# Patient Record
Sex: Female | Born: 1945 | Race: White | Hispanic: No | Marital: Married | Smoking: Never smoker
Health system: Southern US, Community
[De-identification: ages and names within clinical notes are randomized; demographics above are authoritative.]

## PROBLEM LIST (undated history)

## (undated) DIAGNOSIS — R0600 Dyspnea, unspecified: Secondary | ICD-10-CM

## (undated) DIAGNOSIS — Z98811 Dental restoration status: Secondary | ICD-10-CM

## (undated) DIAGNOSIS — F32A Depression, unspecified: Secondary | ICD-10-CM

## (undated) DIAGNOSIS — M1990 Unspecified osteoarthritis, unspecified site: Secondary | ICD-10-CM

## (undated) DIAGNOSIS — Z1211 Encounter for screening for malignant neoplasm of colon: Secondary | ICD-10-CM

## (undated) DIAGNOSIS — N200 Calculus of kidney: Secondary | ICD-10-CM

## (undated) DIAGNOSIS — K649 Unspecified hemorrhoids: Secondary | ICD-10-CM

## (undated) DIAGNOSIS — K219 Gastro-esophageal reflux disease without esophagitis: Secondary | ICD-10-CM

## (undated) DIAGNOSIS — F329 Major depressive disorder, single episode, unspecified: Secondary | ICD-10-CM

## (undated) HISTORY — PX: BREAST SURGERY: SHX581

## (undated) HISTORY — PX: ARTHOSCOPIC ROTAOR CUFF REPAIR: SHX5002

## (undated) HISTORY — PX: LEG SURGERY: SHX1003

## (undated) HISTORY — PX: ABDOMINAL HYSTERECTOMY: SHX81

## (undated) HISTORY — DX: Encounter for screening for malignant neoplasm of colon: Z12.11

---

## 1999-01-23 ENCOUNTER — Encounter: Payer: Self-pay | Admitting: Urology

## 1999-01-23 ENCOUNTER — Encounter: Admission: RE | Admit: 1999-01-23 | Discharge: 1999-01-23 | Payer: Self-pay | Admitting: Urology

## 2000-02-12 ENCOUNTER — Encounter: Admission: RE | Admit: 2000-02-12 | Discharge: 2000-02-12 | Payer: Self-pay | Admitting: Gynecology

## 2000-02-12 ENCOUNTER — Encounter: Payer: Self-pay | Admitting: Gynecology

## 2001-01-31 ENCOUNTER — Inpatient Hospital Stay (HOSPITAL_COMMUNITY): Admission: EM | Admit: 2001-01-31 | Discharge: 2001-02-04 | Payer: Self-pay

## 2001-01-31 ENCOUNTER — Encounter: Payer: Self-pay | Admitting: Emergency Medicine

## 2001-01-31 ENCOUNTER — Encounter: Payer: Self-pay | Admitting: Surgery

## 2001-02-01 ENCOUNTER — Encounter: Payer: Self-pay | Admitting: Orthopedic Surgery

## 2001-07-09 ENCOUNTER — Ambulatory Visit (HOSPITAL_COMMUNITY): Admission: AD | Admit: 2001-07-09 | Discharge: 2001-07-09 | Payer: Self-pay | Admitting: Urology

## 2004-05-23 ENCOUNTER — Ambulatory Visit: Payer: Self-pay | Admitting: Internal Medicine

## 2004-11-09 ENCOUNTER — Ambulatory Visit: Payer: Self-pay

## 2004-12-13 ENCOUNTER — Encounter: Payer: Self-pay | Admitting: General Practice

## 2004-12-27 ENCOUNTER — Encounter: Payer: Self-pay | Admitting: General Practice

## 2005-01-26 ENCOUNTER — Encounter: Payer: Self-pay | Admitting: General Practice

## 2005-05-14 ENCOUNTER — Emergency Department: Payer: Self-pay | Admitting: Emergency Medicine

## 2005-10-01 ENCOUNTER — Ambulatory Visit: Payer: Self-pay | Admitting: Urology

## 2006-03-25 ENCOUNTER — Ambulatory Visit: Payer: Self-pay | Admitting: Urology

## 2006-04-03 ENCOUNTER — Ambulatory Visit: Payer: Self-pay | Admitting: Urology

## 2006-04-09 ENCOUNTER — Ambulatory Visit: Payer: Self-pay | Admitting: Internal Medicine

## 2006-06-29 ENCOUNTER — Emergency Department: Payer: Self-pay | Admitting: Emergency Medicine

## 2006-12-03 ENCOUNTER — Ambulatory Visit: Payer: Self-pay | Admitting: Gastroenterology

## 2006-12-16 ENCOUNTER — Ambulatory Visit: Payer: Self-pay | Admitting: Internal Medicine

## 2007-06-03 ENCOUNTER — Ambulatory Visit: Payer: Self-pay | Admitting: Internal Medicine

## 2007-10-10 ENCOUNTER — Emergency Department: Payer: Self-pay | Admitting: Emergency Medicine

## 2008-05-31 ENCOUNTER — Ambulatory Visit: Payer: Self-pay

## 2008-06-07 ENCOUNTER — Ambulatory Visit: Payer: Self-pay | Admitting: Internal Medicine

## 2009-02-28 ENCOUNTER — Ambulatory Visit: Payer: Self-pay | Admitting: Urology

## 2009-04-04 ENCOUNTER — Ambulatory Visit: Payer: Self-pay | Admitting: Urology

## 2009-04-26 ENCOUNTER — Ambulatory Visit: Payer: Self-pay | Admitting: Urology

## 2009-08-03 ENCOUNTER — Observation Stay: Payer: Self-pay | Admitting: Internal Medicine

## 2009-10-03 ENCOUNTER — Ambulatory Visit: Payer: Self-pay | Admitting: Internal Medicine

## 2011-01-08 ENCOUNTER — Ambulatory Visit: Payer: Self-pay | Admitting: Urology

## 2011-01-23 ENCOUNTER — Ambulatory Visit: Payer: Self-pay | Admitting: Internal Medicine

## 2011-06-05 ENCOUNTER — Ambulatory Visit: Payer: Self-pay | Admitting: Urology

## 2011-07-09 ENCOUNTER — Ambulatory Visit: Payer: Self-pay | Admitting: Unknown Physician Specialty

## 2012-07-08 ENCOUNTER — Ambulatory Visit: Payer: Self-pay | Admitting: Internal Medicine

## 2012-07-22 ENCOUNTER — Ambulatory Visit: Payer: Self-pay | Admitting: Internal Medicine

## 2012-11-30 IMAGING — CR DG ABDOMEN 1V
1 series · 2 of 2 positions shown · non-contrast
Comparison: none

REASON FOR EXAM: Nephrolithiasis and Renal Colic
COMMENTS:

[Series 1: t abdomen supine · 0.14mm/px · 2 of 2 slices shown]
[im 1/2]
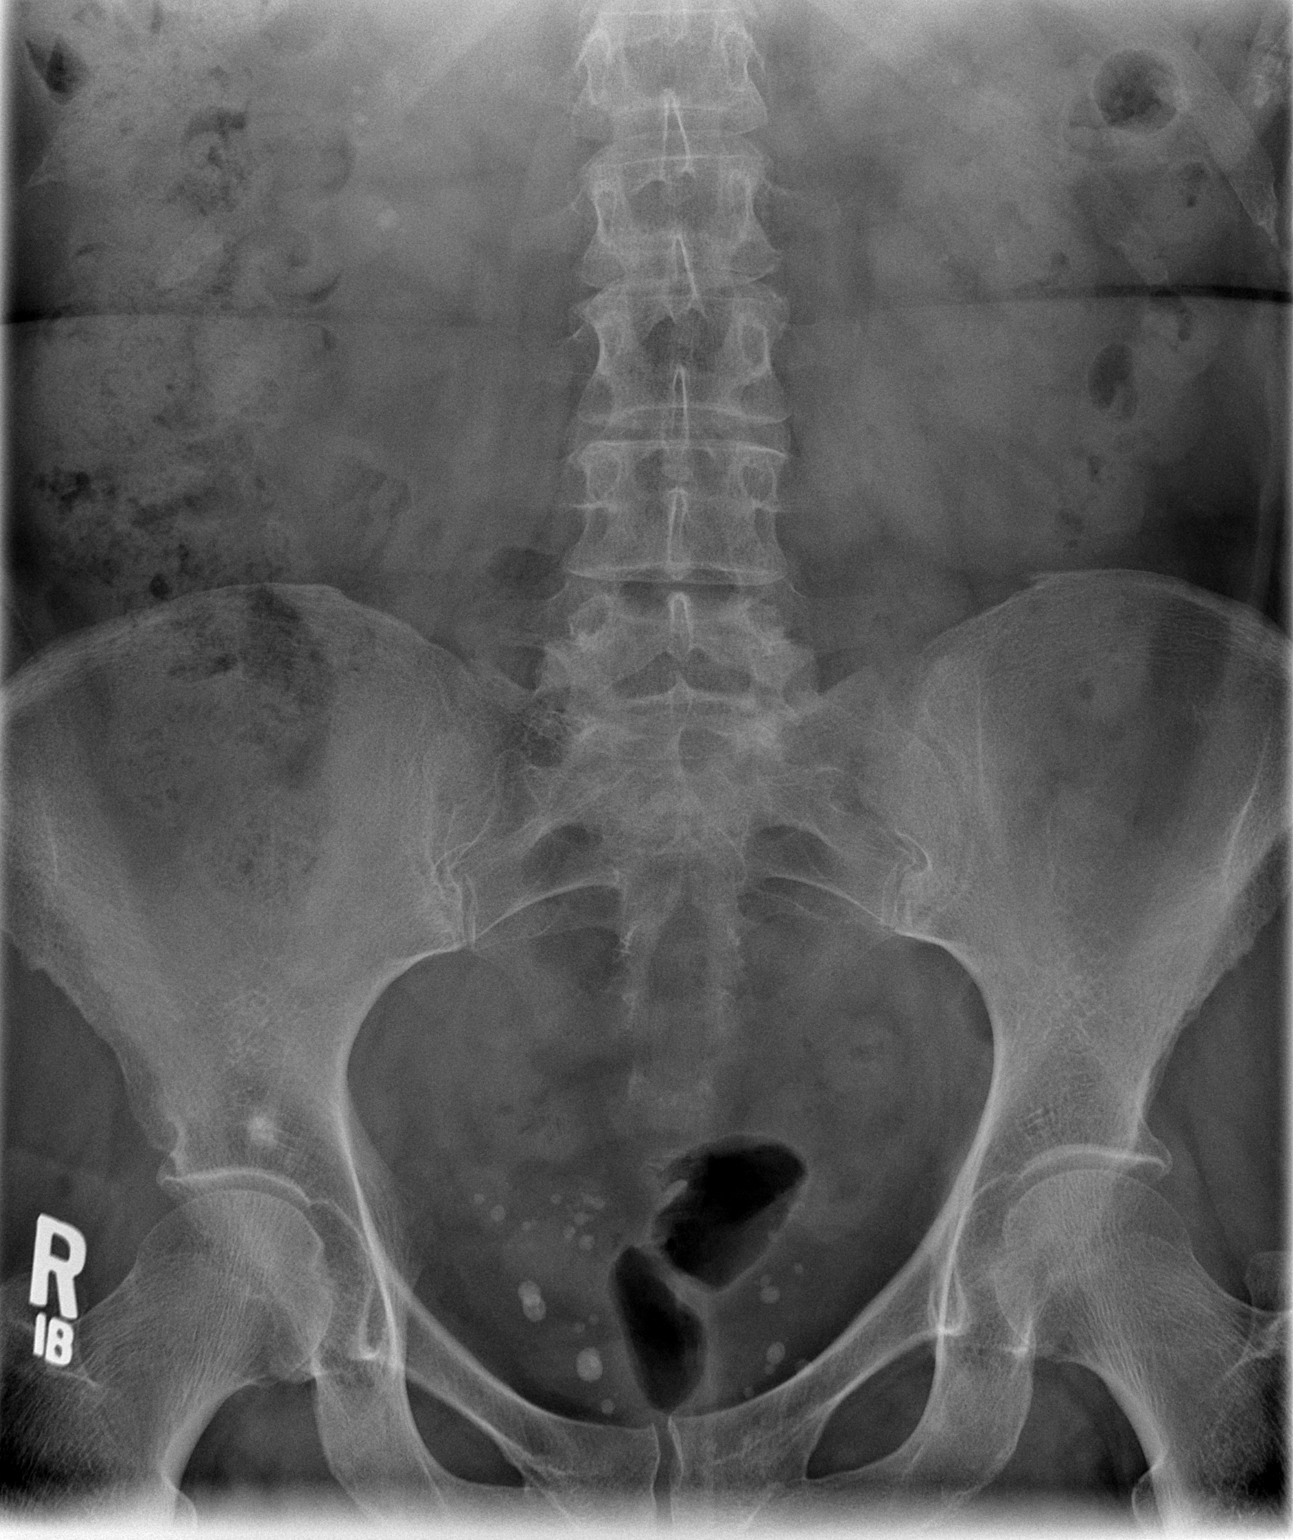
[im 2/2]
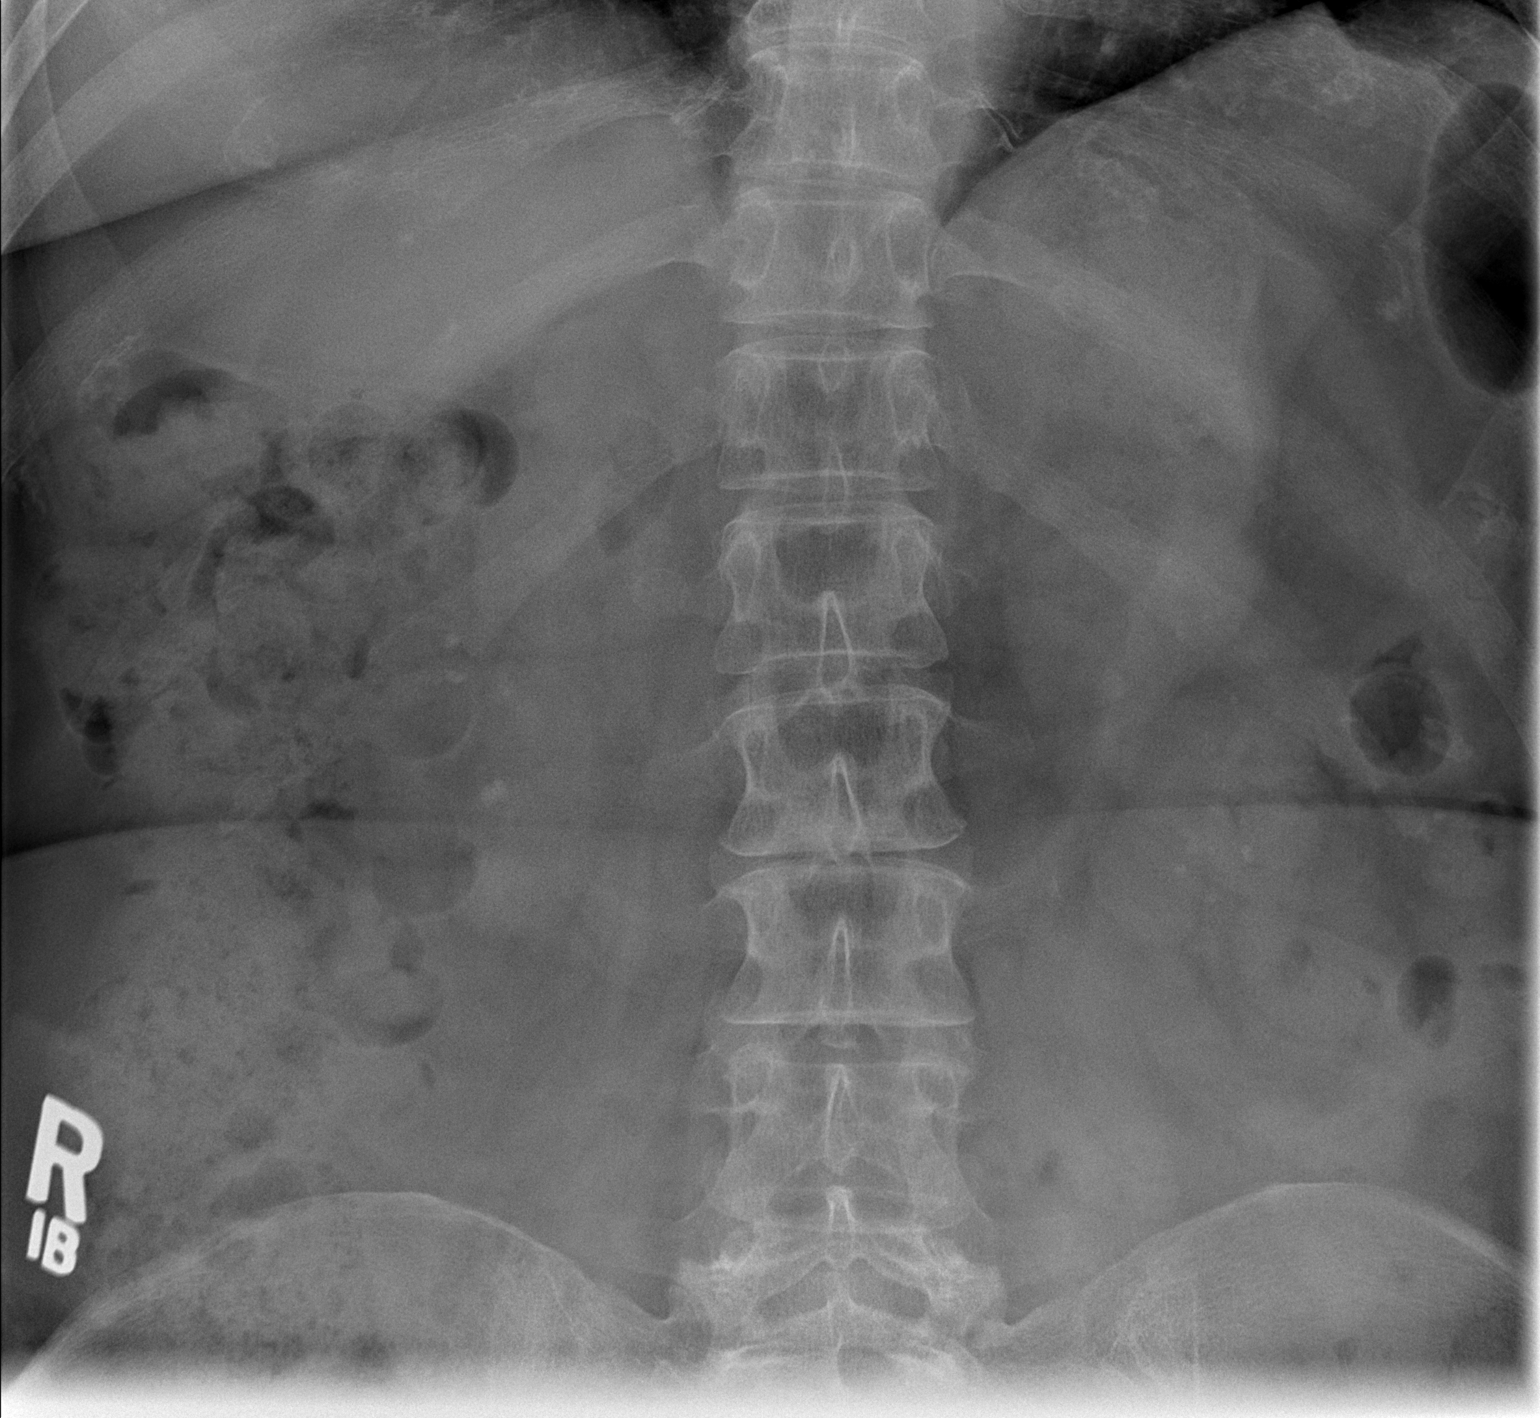

[2 of 2 positions shown; findings below may reference images not displayed]

PROCEDURE:     DXR - DXR KIDNEY URETER BLADDER  - June 05, 2011  [DATE]

RESULT:     Comparison is made to the prior exam of 01/08/2011. There are
again noted multiple 3 to 4 mm calcifications in the mid and lower pole
regions of the left kidney. Calcifications are also seen in the mid and
lower pole of the right kidney with the largest being located at the right
lower pole and measuring approximately 7 mm in diameter. There are noted
multiple calcifications in the pelvic area bilaterally consistent with
phleboliths. No definite ureteral calcifications are seen. The bowel gas
pattern is normal. There is a moderately large amount of fecal material in
the colon. No acute bony abnormalities are seen.
IMPRESSION: 1.     Bilateral nephrolithiasis.

## 2013-11-10 DIAGNOSIS — R05 Cough: Secondary | ICD-10-CM | POA: Insufficient documentation

## 2013-11-10 DIAGNOSIS — F329 Major depressive disorder, single episode, unspecified: Secondary | ICD-10-CM | POA: Insufficient documentation

## 2013-11-10 DIAGNOSIS — F32A Depression, unspecified: Secondary | ICD-10-CM | POA: Insufficient documentation

## 2013-11-10 DIAGNOSIS — N6019 Diffuse cystic mastopathy of unspecified breast: Secondary | ICD-10-CM | POA: Insufficient documentation

## 2013-11-10 DIAGNOSIS — R059 Cough, unspecified: Secondary | ICD-10-CM | POA: Insufficient documentation

## 2013-11-10 DIAGNOSIS — K635 Polyp of colon: Secondary | ICD-10-CM | POA: Insufficient documentation

## 2013-11-23 DIAGNOSIS — M25519 Pain in unspecified shoulder: Secondary | ICD-10-CM | POA: Insufficient documentation

## 2014-01-05 ENCOUNTER — Emergency Department: Payer: Self-pay | Admitting: Emergency Medicine

## 2014-01-30 ENCOUNTER — Emergency Department: Payer: Self-pay | Admitting: Emergency Medicine

## 2014-01-30 LAB — CBC
HCT: 39.8 % (ref 35.0–47.0)
HGB: 13 g/dL (ref 12.0–16.0)
MCH: 29.5 pg (ref 26.0–34.0)
MCHC: 32.6 g/dL (ref 32.0–36.0)
MCV: 91 fL (ref 80–100)
Platelet: 258 10*3/uL (ref 150–440)
RBC: 4.4 10*6/uL (ref 3.80–5.20)
RDW: 13.7 % (ref 11.5–14.5)
WBC: 10.6 10*3/uL (ref 3.6–11.0)

## 2014-01-30 LAB — URINALYSIS, COMPLETE
Bilirubin,UR: NEGATIVE
Glucose,UR: NEGATIVE mg/dL (ref 0–75)
Ketone: NEGATIVE
NITRITE: NEGATIVE
PH: 5 (ref 4.5–8.0)
Protein: 30
RBC,UR: 223 /HPF (ref 0–5)
Specific Gravity: 1.021 (ref 1.003–1.030)
WBC UR: 104 /HPF (ref 0–5)

## 2014-01-30 LAB — COMPREHENSIVE METABOLIC PANEL
ALT: 36 U/L
Albumin: 3.7 g/dL (ref 3.4–5.0)
Alkaline Phosphatase: 92 U/L
Anion Gap: 7 (ref 7–16)
BUN: 20 mg/dL — ABNORMAL HIGH (ref 7–18)
Bilirubin,Total: 0.2 mg/dL (ref 0.2–1.0)
CALCIUM: 8.8 mg/dL (ref 8.5–10.1)
CHLORIDE: 105 mmol/L (ref 98–107)
CO2: 28 mmol/L (ref 21–32)
Creatinine: 1.17 mg/dL (ref 0.60–1.30)
EGFR (African American): 60 — ABNORMAL LOW
EGFR (Non-African Amer.): 49 — ABNORMAL LOW
Glucose: 113 mg/dL — ABNORMAL HIGH (ref 65–99)
Osmolality: 283 (ref 275–301)
Potassium: 3.5 mmol/L (ref 3.5–5.1)
SGOT(AST): 33 U/L (ref 15–37)
Sodium: 140 mmol/L (ref 136–145)
Total Protein: 7.5 g/dL (ref 6.4–8.2)

## 2014-01-30 LAB — LIPASE, BLOOD: Lipase: 230 U/L (ref 73–393)

## 2014-03-16 DIAGNOSIS — N3946 Mixed incontinence: Secondary | ICD-10-CM | POA: Insufficient documentation

## 2014-03-16 DIAGNOSIS — N23 Unspecified renal colic: Secondary | ICD-10-CM | POA: Insufficient documentation

## 2014-03-16 DIAGNOSIS — N200 Calculus of kidney: Secondary | ICD-10-CM | POA: Insufficient documentation

## 2014-08-21 ENCOUNTER — Emergency Department (HOSPITAL_COMMUNITY)
Admission: EM | Admit: 2014-08-21 | Discharge: 2014-08-22 | Disposition: A | Discharge status: Home / Self Care | Payer: Medicare Other | Attending: Emergency Medicine | Admitting: Emergency Medicine

## 2014-08-21 ENCOUNTER — Encounter (HOSPITAL_COMMUNITY): Payer: Self-pay | Admitting: *Deleted

## 2014-08-21 DIAGNOSIS — R509 Fever, unspecified: Secondary | ICD-10-CM | POA: Insufficient documentation

## 2014-08-21 DIAGNOSIS — M791 Myalgia: Secondary | ICD-10-CM | POA: Insufficient documentation

## 2014-08-21 DIAGNOSIS — Z79899 Other long term (current) drug therapy: Secondary | ICD-10-CM | POA: Insufficient documentation

## 2014-08-21 DIAGNOSIS — R000 Tachycardia, unspecified: Secondary | ICD-10-CM | POA: Diagnosis not present

## 2014-08-21 DIAGNOSIS — J029 Acute pharyngitis, unspecified: Secondary | ICD-10-CM | POA: Diagnosis not present

## 2014-08-21 DIAGNOSIS — R531 Weakness: Secondary | ICD-10-CM | POA: Diagnosis present

## 2014-08-21 DIAGNOSIS — R3915 Urgency of urination: Secondary | ICD-10-CM | POA: Diagnosis not present

## 2014-08-21 DIAGNOSIS — R6889 Other general symptoms and signs: Secondary | ICD-10-CM

## 2014-08-21 HISTORY — DX: Depression, unspecified: F32.A

## 2014-08-21 HISTORY — DX: Major depressive disorder, single episode, unspecified: F32.9

## 2014-08-21 NOTE — ED Notes (Signed)
Pt reports general body aches & weakness that started this morning around 0900.

## 2014-08-22 ENCOUNTER — Emergency Department (HOSPITAL_COMMUNITY): Payer: Medicare Other

## 2014-08-22 LAB — CBC WITH DIFFERENTIAL/PLATELET
Basophils Absolute: 0 10*3/uL (ref 0.0–0.1)
Basophils Relative: 0 % (ref 0–1)
Eosinophils Absolute: 0.2 10*3/uL (ref 0.0–0.7)
Eosinophils Relative: 3 % (ref 0–5)
HEMATOCRIT: 40.5 % (ref 36.0–46.0)
HEMOGLOBIN: 13 g/dL (ref 12.0–15.0)
Lymphocytes Relative: 13 % (ref 12–46)
Lymphs Abs: 0.8 10*3/uL (ref 0.7–4.0)
MCH: 28.4 pg (ref 26.0–34.0)
MCHC: 32.1 g/dL (ref 30.0–36.0)
MCV: 88.4 fL (ref 78.0–100.0)
MONOS PCT: 9 % (ref 3–12)
Monocytes Absolute: 0.5 10*3/uL (ref 0.1–1.0)
NEUTROS PCT: 75 % (ref 43–77)
Neutro Abs: 4.3 10*3/uL (ref 1.7–7.7)
PLATELETS: 217 10*3/uL (ref 150–400)
RBC: 4.58 MIL/uL (ref 3.87–5.11)
RDW: 13.6 % (ref 11.5–15.5)
WBC: 5.8 10*3/uL (ref 4.0–10.5)

## 2014-08-22 LAB — URINE MICROSCOPIC-ADD ON

## 2014-08-22 LAB — COMPREHENSIVE METABOLIC PANEL
ALBUMIN: 3.8 g/dL (ref 3.5–5.0)
ALT: 22 U/L (ref 14–54)
ANION GAP: 9 (ref 5–15)
AST: 29 U/L (ref 15–41)
Alkaline Phosphatase: 92 U/L (ref 38–126)
BUN: 16 mg/dL (ref 6–20)
CALCIUM: 8.7 mg/dL — AB (ref 8.9–10.3)
CO2: 26 mmol/L (ref 22–32)
Chloride: 103 mmol/L (ref 101–111)
Creatinine, Ser: 0.97 mg/dL (ref 0.44–1.00)
GFR calc non Af Amer: 60 mL/min — ABNORMAL LOW (ref >60)
Glucose, Bld: 103 mg/dL — ABNORMAL HIGH (ref 65–99)
Potassium: 4 mmol/L (ref 3.5–5.1)
Sodium: 138 mmol/L (ref 135–145)
Total Bilirubin: 0.3 mg/dL (ref 0.3–1.2)
Total Protein: 7.4 g/dL (ref 6.5–8.1)

## 2014-08-22 LAB — URINALYSIS, ROUTINE W REFLEX MICROSCOPIC
Bilirubin Urine: NEGATIVE
Glucose, UA: NEGATIVE mg/dL
KETONES UR: NEGATIVE mg/dL
Leukocytes, UA: NEGATIVE
NITRITE: NEGATIVE
Protein, ur: NEGATIVE mg/dL
Specific Gravity, Urine: 1.015 (ref 1.005–1.030)
UROBILINOGEN UA: 0.2 mg/dL (ref 0.0–1.0)
pH: 8 (ref 5.0–8.0)

## 2014-08-22 LAB — RAPID STREP SCREEN (MED CTR MEBANE ONLY): Streptococcus, Group A Screen (Direct): NEGATIVE

## 2014-08-22 LAB — I-STAT CG4 LACTIC ACID, ED: Lactic Acid, Venous: 1.4 mmol/L (ref 0.5–2.0)

## 2014-08-22 MED ORDER — SODIUM CHLORIDE 0.9 % IV BOLUS (SEPSIS)
1000.0000 mL | INTRAVENOUS | Status: AC
  Administered 2014-08-22 (×2): 1000 mL via INTRAVENOUS

## 2014-08-22 MED ORDER — SODIUM CHLORIDE 0.9 % IV SOLN: 1000.0000 mL | INTRAVENOUS | Status: DC

## 2014-08-22 NOTE — Discharge Instructions (Signed)
Drink plenty of fluids. Take acetaminophen and/or ibuprofen as needed. Return if symptoms are getting worse.

## 2014-08-22 NOTE — ED Provider Notes (Signed)
CSN: [id number]     Arrival date & time 08/21/14  2321 History  This chart was scribed for Delora Fuel, MD by Randa Evens, ED Scribe. This patient was seen in room APA11/AP[facility]11 and the patient's care was started at 12:15 AM.      Chief Complaint  Patient presents with  . Weakness    Patient is a 69 y.o. female presenting with weakness. The history is provided by the patient. No language interpreter was used.  Weakness   HPI Comments: Sharon [name] is a 69 y.o. female who presents to the Emergency Department complaining of new weakness onset this morning at 9 PM. Pt rates the severity of her symptoms 6/10. Pt states that she has associated fever, chills, sore throat, myalgias, urinary urgency. Pt doesn't report any medications PTA. Pt does report any alleviating or worsening factors. Denies cough, nausea, constipation, diarrhea or dysuria.   Past Medical History  Diagnosis Date  . Depression    Past Surgical History  Procedure Laterality Date  . Arthoscopic rotaor cuff repair    . Abdominal hysterectomy    . Leg surgery    . Breast surgery     No family history on file. History  Substance Use Topics  . Smoking status: Never Smoker   . Smokeless tobacco: Not on file  . Alcohol Use: No   OB History    No data available      Review of Systems  Constitutional: Positive for fever and chills.  HENT: Positive for sore throat.   Genitourinary: Positive for urgency.  Musculoskeletal: Positive for myalgias.  Neurological: Positive for weakness.  All other systems reviewed and are negative.    Allergies  Review of patient's allergies indicates no known allergies.  Home Medications   Prior to Admission medications   Medication Sig Start Date End Date Taking? Authorizing Provider  buPROPion (WELLBUTRIN) 75 MG tablet Take 75 mg by mouth 2 (two) times daily.   Yes Historical Provider, MD  diphenhydrAMINE (BENADRYL) 25 MG tablet Take 25 mg by mouth every 6 (six) hours as  needed.   Yes Historical Provider, MD  DULoxetine (CYMBALTA) 20 MG capsule Take 20 mg by mouth daily.   Yes Historical Provider, MD  oxyCODONE (OXY IR/ROXICODONE) 5 MG immediate release tablet Take 5 mg by mouth every 4 (four) hours as needed for severe pain.   Yes Historical Provider, MD   BP 148/90 mmHg  Pulse 124  Temp(Src) 100.4 F (38 C) (Oral)  Resp 24  Ht 5\' 8"  (1.727 m)  Wt 195 lb (88.451 kg)  BMI 29.66 kg/m2  SpO2 95%   Physical Exam  Constitutional: She is oriented to person, place, and time. She appears well-developed and well-nourished. No distress.  HENT:  Head: Normocephalic and atraumatic.  Eyes: Conjunctivae and EOM are normal. Pupils are equal, round, and reactive to light.  Neck: Normal range of motion. Neck supple. No JVD present.  Cardiovascular: Regular rhythm and normal heart sounds.  Tachycardia present.   No murmur heard. Pulmonary/Chest: Effort normal and breath sounds normal. She has no wheezes. She has no rales. She exhibits no tenderness.  Abdominal: Soft. Bowel sounds are normal. She exhibits no distension and no mass. There is no tenderness.  Musculoskeletal: Normal range of motion. She exhibits no edema.  Lymphadenopathy:    She has no cervical adenopathy.  Neurological: She is alert and oriented to person, place, and time. No cranial nerve deficit. She exhibits normal muscle tone. Coordination normal.  Skin: Skin is warm and dry. No rash noted.  Psychiatric: She has a normal mood and affect. Her behavior is normal. Judgment and thought content normal.  Nursing note and vitals reviewed.   ED Course  Procedures (including critical care time) DIAGNOSTIC STUDIES: Oxygen Saturation is 95% on RA, adequate by my interpretation.    COORDINATION OF CARE: 12:32 AM-Discussed treatment plan with pt at bedside and pt agreed to plan.     Labs Review Results for orders placed or performed during the hospital encounter of 08/21/14  Rapid strep screen (not  at [facility])  Result Value Ref Range   Streptococcus, Group A Screen (Direct) NEGATIVE NEGATIVE  Comprehensive metabolic panel  Result Value Ref Range   Sodium 138 135 - 145 mmol/L   Potassium 4.0 3.5 - 5.1 mmol/L   Chloride 103 101 - 111 mmol/L   CO2 26 22 - 32 mmol/L   Glucose, Bld 103 (H) 65 - 99 mg/dL   BUN 16 6 - 20 mg/dL   Creatinine, Ser 0.97 0.44 - 1.00 mg/dL   Calcium 8.7 (L) 8.9 - 10.3 mg/dL   Total Protein 7.4 6.5 - 8.1 g/dL   Albumin 3.8 3.5 - 5.0 g/dL   AST 29 15 - 41 U/L   ALT 22 14 - 54 U/L   Alkaline Phosphatase 92 38 - 126 U/L   Total Bilirubin 0.3 0.3 - 1.2 mg/dL   GFR calc non Af Amer 60 (L) >60 mL/min   GFR calc Af Amer >60 >60 mL/min   Anion gap 9 5 - 15  CBC WITH DIFFERENTIAL  Result Value Ref Range   WBC 5.8 4.0 - 10.5 K/uL   RBC 4.58 3.87 - 5.11 MIL/uL   Hemoglobin 13.0 12.0 - 15.0 g/dL   HCT 40.5 36.0 - 46.0 %   MCV 88.4 78.0 - 100.0 fL   MCH 28.4 26.0 - 34.0 pg   MCHC 32.1 30.0 - 36.0 g/dL   RDW 13.6 11.5 - 15.5 %   Platelets 217 150 - 400 K/uL   Neutrophils Relative % 75 43 - 77 %   Neutro Abs 4.3 1.7 - 7.7 K/uL   Lymphocytes Relative 13 12 - 46 %   Lymphs Abs 0.8 0.7 - 4.0 K/uL   Monocytes Relative 9 3 - 12 %   Monocytes Absolute 0.5 0.1 - 1.0 K/uL   Eosinophils Relative 3 0 - 5 %   Eosinophils Absolute 0.2 0.0 - 0.7 K/uL   Basophils Relative 0 0 - 1 %   Basophils Absolute 0.0 0.0 - 0.1 K/uL  Urinalysis, Routine w reflex microscopic (not at [facility])  Result Value Ref Range   Color, Urine YELLOW YELLOW   APPearance CLEAR CLEAR   Specific Gravity, Urine 1.015 1.005 - 1.030   pH 8.0 5.0 - 8.0   Glucose, UA NEGATIVE NEGATIVE mg/dL   Hgb urine dipstick TRACE (A) NEGATIVE   Bilirubin Urine NEGATIVE NEGATIVE   Ketones, ur NEGATIVE NEGATIVE mg/dL   Protein, ur NEGATIVE NEGATIVE mg/dL   Urobilinogen, UA 0.2 0.0 - 1.0 mg/dL   Nitrite NEGATIVE NEGATIVE   Leukocytes, UA NEGATIVE NEGATIVE  Urine microscopic-add on  Result Value Ref Range   Squamous  Epithelial / LPF FEW (A) RARE   WBC, UA 7-10 <3 WBC/hpf   RBC / HPF 3-6 <3 RBC/hpf   Bacteria, UA RARE RARE  I-Stat CG4 Lactic Acid, ED  (not at  [facility])  Result Value Ref Range   Lactic Acid, Venous 1.40  0.5 - 2.0 mmol/L   Imaging Review Dg Chest 2 View  08/22/2014   CLINICAL DATA:  Weakness, fever/chills, myalgias  EXAM: CHEST  2 VIEW  COMPARISON:  07/09/2011  FINDINGS: Lungs are clear.  No pleural effusion or pneumothorax.  The heart is normal in size.  Degenerative changes of the visualized thoracolumbar spine.  IMPRESSION: No evidence of acute cardiopulmonary disease.   Electronically Signed   By: Julian Hy M.D.   On: 08/22/2014 01:43     EKG Interpretation   Date/Time:  Saturday August 21 2014 23:54:04 EDT Ventricular Rate:  118 PR Interval:  159 QRS Duration: 69 QT Interval:  289 QTC Calculation: 405 R Axis:   -11 Text Interpretation:  Sinus tachycardia Low voltage, precordial leads When  compared with ECG of 07/09/2001, HEART RATE has increased Confirmed by  Roxanne Mins  MD, Reigna Ruperto ([id number]) on 08/22/2014 12:39:15 AM      MDM   Final diagnoses:  Flu-like symptoms      Malaise with tachycardia. Low-grade fever. Sepsis workup is initiated. Although she does have tachycardia, she does not have any altered mentation or hypotension and I do not see evidence of true sepsis. Early goal-directed fluid therapy was initiated and laboratory workup obtained. Lactic acid level is normal and WBC is normal. Urinalysis shows no evidence of infection and chest x-ray is negative. With hydration, heart rate has come down to just over 100. Patient needs to be nontoxic in appearance. This appears to be a viral illness. No indication for Anaprox at this point. Patient is informed of findings and is discharged with extractions to maintain hydration and take acetaminophen and ibuprofen for fever aching. Follow-up with PCP in 2 days. Return to ED if symptoms worsen.   I personally performed the  services described in this documentation, which was scribed in my presence. The recorded information has been reviewed and is accurate.       Delora Fuel, MD 19/14/78 [id number]

## 2014-08-22 NOTE — ED Notes (Signed)
Pt left ED with no signs of distress. Pt verbalized discharge instructions.

## 2014-08-24 LAB — URINE CULTURE

## 2014-08-24 LAB — CULTURE, GROUP A STREP: Strep A Culture: NEGATIVE

## 2014-08-25 ENCOUNTER — Encounter (HOSPITAL_COMMUNITY): Payer: Self-pay | Admitting: Emergency Medicine

## 2014-08-25 ENCOUNTER — Emergency Department (HOSPITAL_COMMUNITY)
Admission: EM | Admit: 2014-08-25 | Discharge: 2014-08-25 | Disposition: A | Discharge status: Home / Self Care | Payer: Medicare Other | Attending: Emergency Medicine | Admitting: Emergency Medicine

## 2014-08-25 DIAGNOSIS — Z79899 Other long term (current) drug therapy: Secondary | ICD-10-CM | POA: Diagnosis not present

## 2014-08-25 DIAGNOSIS — R748 Abnormal levels of other serum enzymes: Secondary | ICD-10-CM | POA: Insufficient documentation

## 2014-08-25 DIAGNOSIS — M791 Myalgia, unspecified site: Secondary | ICD-10-CM

## 2014-08-25 DIAGNOSIS — F329 Major depressive disorder, single episode, unspecified: Secondary | ICD-10-CM | POA: Diagnosis not present

## 2014-08-25 DIAGNOSIS — R531 Weakness: Secondary | ICD-10-CM | POA: Diagnosis present

## 2014-08-25 LAB — URINALYSIS, ROUTINE W REFLEX MICROSCOPIC
Bilirubin Urine: NEGATIVE
Glucose, UA: NEGATIVE mg/dL
HGB URINE DIPSTICK: NEGATIVE
Ketones, ur: NEGATIVE mg/dL
Nitrite: NEGATIVE
PROTEIN: NEGATIVE mg/dL
Specific Gravity, Urine: >1.03 — ABNORMAL HIGH (ref 1.005–1.030)
Urobilinogen, UA: 0.2 mg/dL (ref 0.0–1.0)
pH: 5.5 (ref 5.0–8.0)

## 2014-08-25 LAB — BASIC METABOLIC PANEL
Anion gap: 9 (ref 5–15)
BUN: 28 mg/dL — ABNORMAL HIGH (ref 6–20)
CHLORIDE: 103 mmol/L (ref 101–111)
CO2: 25 mmol/L (ref 22–32)
Calcium: 8.6 mg/dL — ABNORMAL LOW (ref 8.9–10.3)
Creatinine, Ser: 1.09 mg/dL — ABNORMAL HIGH (ref 0.44–1.00)
GFR calc Af Amer: 60 mL/min — ABNORMAL LOW (ref >60)
GFR, EST NON AFRICAN AMERICAN: 52 mL/min — AB (ref >60)
Glucose, Bld: 91 mg/dL (ref 65–99)
Potassium: 4.2 mmol/L (ref 3.5–5.1)
SODIUM: 137 mmol/L (ref 135–145)

## 2014-08-25 LAB — CBC WITH DIFFERENTIAL/PLATELET
BASOS ABS: 0 10*3/uL (ref 0.0–0.1)
BASOS PCT: 0 % (ref 0–1)
EOS ABS: 0.2 10*3/uL (ref 0.0–0.7)
Eosinophils Relative: 4 % (ref 0–5)
HCT: 42.8 % (ref 36.0–46.0)
Hemoglobin: 13.6 g/dL (ref 12.0–15.0)
LYMPHS ABS: 1.6 10*3/uL (ref 0.7–4.0)
Lymphocytes Relative: 28 % (ref 12–46)
MCH: 28.2 pg (ref 26.0–34.0)
MCHC: 31.8 g/dL (ref 30.0–36.0)
MCV: 88.6 fL (ref 78.0–100.0)
Monocytes Absolute: 0.4 10*3/uL (ref 0.1–1.0)
Monocytes Relative: 7 % (ref 3–12)
NEUTROS PCT: 61 % (ref 43–77)
Neutro Abs: 3.3 10*3/uL (ref 1.7–7.7)
Platelets: 251 10*3/uL (ref 150–400)
RBC: 4.83 MIL/uL (ref 3.87–5.11)
RDW: 13.7 % (ref 11.5–15.5)
WBC: 5.5 10*3/uL (ref 4.0–10.5)

## 2014-08-25 LAB — CK: CK TOTAL: 1060 U/L — AB (ref 38–234)

## 2014-08-25 LAB — CBG MONITORING, ED: GLUCOSE-CAPILLARY: 101 mg/dL — AB (ref 65–99)

## 2014-08-25 LAB — URINE MICROSCOPIC-ADD ON

## 2014-08-25 MED ORDER — SODIUM CHLORIDE 0.9 % IV BOLUS (SEPSIS)
1000.0000 mL | Freq: Once | INTRAVENOUS | Status: AC
  Administered 2014-08-25: 1000 mL via INTRAVENOUS

## 2014-08-25 NOTE — ED Provider Notes (Signed)
CSN: [id number]     Arrival date & time 08/25/14  1441 History   First MD Initiated Contact with Patient 08/25/14 1739     Chief Complaint  Patient presents with  . Weakness     (Consider location/radiation/quality/duration/timing/severity/associated sxs/prior Treatment) Patient is a 69 y.o. female presenting with weakness. The history is provided by the patient.  Weakness Pertinent negatives include no chest pain, no abdominal pain, no headaches and no shortness of breath.   patient presents with generalized weakness. Was seen in the ER a few days ago and diagnosed a flulike illness. 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 States was worse in her arms compared to legs. States he didn't figure out the reason why it happened. States she's been feeling somewhat better since. No further fever. Slight sore throat. No headache. No neck pain. States she still feels bad all over. Patient is concerned that she may have Lyme disease. She also wants to be tested for [place] virus because she lives [place] to people that live off the [place] and she says that mosquitoes, come  from their house. She has not seen any tick on her. No recent travel to tropical areas.  Past Medical History  Diagnosis Date  . Depression    Past Surgical History  Procedure Laterality Date  . Arthoscopic rotaor cuff repair    . Abdominal hysterectomy    . Leg surgery    . Breast surgery     No family history on file. History  Substance Use Topics  . Smoking status: Never Smoker   . Smokeless tobacco: Not on file  . Alcohol Use: No   OB History    No data available     Review of Systems  Constitutional: Negative for activity change and appetite change.  Eyes: Negative for pain.  Respiratory: Negative for chest tightness and shortness of breath.   Cardiovascular: Negative for chest pain and leg swelling.  Gastrointestinal: Negative for nausea, vomiting, abdominal pain and  diarrhea.  Genitourinary: Negative for flank pain.  Musculoskeletal: Positive for myalgias. Negative for back pain and neck stiffness.  Skin: Negative for rash.  Neurological: Positive for weakness. Negative for numbness and headaches.  Psychiatric/Behavioral: Negative for behavioral problems.      Allergies  Review of patient's allergies indicates no known allergies.  Home Medications   Prior to Admission medications   Medication Sig Start Date End Date Taking? Authorizing Provider  buPROPion (WELLBUTRIN) 75 MG tablet Take 75 mg by mouth daily.    Yes Historical Provider, MD  Chlorpheniramine-Pseudoeph (RINADE-BID PO) Take 2 tablets by mouth daily as needed (acid reflux).   Yes Historical Provider, MD  diphenhydrAMINE (BENADRYL) 25 MG tablet Take 25 mg by mouth every 6 (six) hours as needed for sleep.    Yes Historical Provider, MD  DULoxetine (CYMBALTA) 20 MG capsule Take 20 mg by mouth daily.   Yes Historical Provider, MD  Multiple Vitamins-Minerals (HAIR/SKIN/NAILS PO) Take 1 tablet by mouth daily.   Yes Historical Provider, MD  naproxen sodium (ANAPROX) 220 MG tablet Take 440 mg by mouth daily as needed (pain).   Yes Historical Provider, MD  oxyCODONE (OXY IR/ROXICODONE) 5 MG immediate release tablet Take 5 mg by mouth every 4 (four) hours as needed for severe pain.   Yes Historical Provider, MD   BP 133/93 mmHg  Pulse 97  Temp(Src) 98.1 F (36.7 C) (Oral)  Resp 20  SpO2 95% Physical Exam  Constitutional: She is oriented to person, place, and time. She appears well-developed and well-nourished.  HENT:  Head: Normocephalic and atraumatic.  Mouth/Throat: No oropharyngeal exudate.  Eyes: EOM are normal. Pupils are equal, round, and reactive to light.  Neck: Normal range of motion. Neck supple.  Cardiovascular: Normal rate, regular rhythm and normal heart sounds.   No murmur heard. Pulmonary/Chest: Effort normal and breath sounds normal. No respiratory distress. She has no  wheezes. She has no rales.  Abdominal: Soft. Bowel sounds are normal. She exhibits no distension. There is no tenderness. There is no rebound and no guarding.  Musculoskeletal: Normal range of motion. She exhibits tenderness.  Thighs and bilateral forearms. Good distal pulses. No tenderness of her Biceps.  Neurological: She is alert and oriented to person, place, and time. No cranial nerve deficit.  Skin: Skin is warm and dry.  Psychiatric: She has a normal mood and affect. Her speech is normal.  Nursing note and vitals reviewed.   ED Course  Procedures (including critical care time) Labs Review Labs Reviewed  CK - Abnormal; Notable for the following:    Total CK 1060 (*)    All other components within normal limits  BASIC METABOLIC PANEL - Abnormal; Notable for the following:    BUN 28 (*)    Creatinine, Ser 1.09 (*)    Calcium 8.6 (*)    GFR calc non Af Amer 52 (*)    GFR calc Af Amer 60 (*)    All other components within normal limits  URINALYSIS, ROUTINE W REFLEX MICROSCOPIC (NOT AT [facility]) - Abnormal; Notable for the following:    Specific Gravity, Urine >1.030 (*)    Leukocytes, UA TRACE (*)    All other components within normal limits  URINE MICROSCOPIC-ADD ON - Abnormal; Notable for the following:    Squamous Epithelial / LPF MANY (*)    Casts HYALINE CASTS (*)    All other components within normal limits  CBG MONITORING, ED - Abnormal; Notable for the following:    Glucose-Capillary 101 (*)    All other components within normal limits  URINE CULTURE  CBC WITH DIFFERENTIAL/PLATELET    Imaging Review No results found.   EKG Interpretation   Date/Time:  Wednesday August 25 2014 15:03:26 EDT Ventricular Rate:  99 PR Interval:  158 QRS Duration: 74 QT Interval:  342 QTC Calculation: 438 R Axis:   51 Text Interpretation:  Normal sinus rhythm Normal ECG No significant change  since last tracing Confirmed by Christy Gentles  MD, DONALD ([id number]) on 08/25/2014  7:04:05 PM       MDM   Final diagnoses:  Myalgia  Elevated CK    Patient generalized weakness. Potentially worsen arms the overall exam is reassuring. Labs from last visit reviewed. CK is mildly elevated at thousand may need following. No urinary abnormalities. Creatinine is barely above normal. Will need to follow-up with her PCP. Last urine culture had multiple organisms and no one has been sent.    Davonna Belling, MD 08/25/14 2037

## 2014-08-25 NOTE — ED Notes (Signed)
Patient verbalizes understanding of discharge instructions, home care and follow up care. Patient ambulatory out of department at this time with family. 

## 2014-08-25 NOTE — ED Notes (Signed)
Pt c/o generalized weakness and body aches since Saturday. Pt also c/o low grade fever and diaphoresis.

## 2014-08-25 NOTE — Discharge Instructions (Signed)
Follow-up through Dr. your CK was elevated and may need a recheck. No clear cause of your feeling bad was found but it could be due to viral illness or something else. Return for change in your urine.

## 2014-08-27 LAB — URINE CULTURE: CULTURE: NO GROWTH

## 2014-08-28 LAB — CULTURE, BLOOD (ROUTINE X 2)
Culture: NO GROWTH
Culture: NO GROWTH

## 2015-02-01 ENCOUNTER — Telehealth: Payer: Self-pay | Admitting: *Deleted

## 2015-02-01 NOTE — Telephone Encounter (Signed)
patient requested a referral to see Dr. Patrina Levering. Patient stated her right ear has drainage. Please Advise. Patient also started a new medication, and stated that she needed blood work. Please Advise

## 2015-02-02 NOTE — Telephone Encounter (Signed)
Not our patient

## 2015-03-30 ENCOUNTER — Emergency Department (HOSPITAL_COMMUNITY): Payer: Medicare HMO

## 2015-03-30 ENCOUNTER — Emergency Department (HOSPITAL_COMMUNITY)
Admission: EM | Admit: 2015-03-30 | Discharge: 2015-03-30 | Disposition: A | Discharge status: Home / Self Care | Payer: Medicare HMO | Attending: Emergency Medicine | Admitting: Emergency Medicine

## 2015-03-30 ENCOUNTER — Encounter (HOSPITAL_COMMUNITY): Payer: Self-pay

## 2015-03-30 DIAGNOSIS — R531 Weakness: Secondary | ICD-10-CM

## 2015-03-30 DIAGNOSIS — R42 Dizziness and giddiness: Secondary | ICD-10-CM | POA: Diagnosis not present

## 2015-03-30 DIAGNOSIS — Z880 Allergy status to penicillin: Secondary | ICD-10-CM | POA: Diagnosis not present

## 2015-03-30 DIAGNOSIS — R55 Syncope and collapse: Secondary | ICD-10-CM

## 2015-03-30 DIAGNOSIS — F329 Major depressive disorder, single episode, unspecified: Secondary | ICD-10-CM | POA: Diagnosis not present

## 2015-03-30 DIAGNOSIS — Z79899 Other long term (current) drug therapy: Secondary | ICD-10-CM | POA: Diagnosis not present

## 2015-03-30 LAB — I-STAT CHEM 8, ED
BUN: 26 mg/dL — ABNORMAL HIGH (ref 6–20)
CREATININE: 1 mg/dL (ref 0.44–1.00)
Calcium, Ion: 1.16 mmol/L (ref 1.13–1.30)
Chloride: 103 mmol/L (ref 101–111)
GLUCOSE: 124 mg/dL — AB (ref 65–99)
HCT: 49 % — ABNORMAL HIGH (ref 36.0–46.0)
Hemoglobin: 16.7 g/dL — ABNORMAL HIGH (ref 12.0–15.0)
POTASSIUM: 3.5 mmol/L (ref 3.5–5.1)
Sodium: 142 mmol/L (ref 135–145)
TCO2: 25 mmol/L (ref 0–100)

## 2015-03-30 LAB — COMPREHENSIVE METABOLIC PANEL
ALT: 20 U/L (ref 14–54)
AST: 22 U/L (ref 15–41)
Albumin: 4.1 g/dL (ref 3.5–5.0)
Alkaline Phosphatase: 96 U/L (ref 38–126)
Anion gap: 8 (ref 5–15)
BUN: 25 mg/dL — ABNORMAL HIGH (ref 6–20)
CHLORIDE: 104 mmol/L (ref 101–111)
CO2: 26 mmol/L (ref 22–32)
Calcium: 8.9 mg/dL (ref 8.9–10.3)
Creatinine, Ser: 1.07 mg/dL — ABNORMAL HIGH (ref 0.44–1.00)
GFR, EST NON AFRICAN AMERICAN: 52 mL/min — AB (ref >60)
Glucose, Bld: 124 mg/dL — ABNORMAL HIGH (ref 65–99)
POTASSIUM: 3.6 mmol/L (ref 3.5–5.1)
SODIUM: 138 mmol/L (ref 135–145)
Total Bilirubin: 0.7 mg/dL (ref 0.3–1.2)
Total Protein: 7.7 g/dL (ref 6.5–8.1)

## 2015-03-30 LAB — CBC WITH DIFFERENTIAL/PLATELET
Basophils Absolute: 0 10*3/uL (ref 0.0–0.1)
Basophils Relative: 0 %
EOS PCT: 2 %
Eosinophils Absolute: 0.2 10*3/uL (ref 0.0–0.7)
HCT: 45.1 % (ref 36.0–46.0)
HEMOGLOBIN: 14.6 g/dL (ref 12.0–15.0)
LYMPHS ABS: 4.4 10*3/uL — AB (ref 0.7–4.0)
LYMPHS PCT: 30 %
MCH: 28.7 pg (ref 26.0–34.0)
MCHC: 32.4 g/dL (ref 30.0–36.0)
MCV: 88.6 fL (ref 78.0–100.0)
Monocytes Absolute: 1.2 10*3/uL — ABNORMAL HIGH (ref 0.1–1.0)
Monocytes Relative: 8 %
NEUTROS ABS: 9.1 10*3/uL — AB (ref 1.7–7.7)
NEUTROS PCT: 61 %
Platelets: 287 10*3/uL (ref 150–400)
RBC: 5.09 MIL/uL (ref 3.87–5.11)
RDW: 13.3 % (ref 11.5–15.5)
WBC: 14.9 10*3/uL — ABNORMAL HIGH (ref 4.0–10.5)

## 2015-03-30 LAB — I-STAT TROPONIN, ED: TROPONIN I, POC: 0 ng/mL (ref 0.00–0.08)

## 2015-03-30 LAB — PROTIME-INR
INR: 0.98 (ref 0.00–1.49)
Prothrombin Time: 13.2 seconds (ref 11.6–15.2)

## 2015-03-30 LAB — TROPONIN I: Troponin I: <0.03 ng/mL (ref <0.031)

## 2015-03-30 LAB — ETHANOL

## 2015-03-30 LAB — APTT: APTT: 28 s (ref 24–37)

## 2015-03-30 LAB — D-DIMER, QUANTITATIVE (NOT AT ARMC)

## 2015-03-30 MED ORDER — SODIUM CHLORIDE 0.9 % IV BOLUS (SEPSIS)
1000.0000 mL | Freq: Once | INTRAVENOUS | Status: AC
  Administered 2015-03-30: 1000 mL via INTRAVENOUS

## 2015-03-30 NOTE — ED Notes (Signed)
Ems reports pt was sitting down having her nails done and had a syncopal episode.  Staff called 911.  Pt was alert and oriented when ems arrived but c/o feeling extremely weak and hot.  Pt diaphoretic.  Denies any pain.  Pt alert and oriented x 3.  CBG with ems was 102, bp 120/83 and HR 80's.

## 2015-03-30 NOTE — Discharge Instructions (Signed)
Drink plenty of fluids. Try the a nutritious breakfast in the morning. Rest for the next couple days. Follow-up with her PCP next week

## 2015-03-30 NOTE — ED Notes (Signed)
Pt says feels better at this time. However, pt stood up for standing bp and became very dizzy and was unable to tolerate standing for the bp measurement.  Pt back in bed, feels better laying down.

## 2015-03-30 NOTE — Progress Notes (Signed)
Call from RN stating Code Stroke 1414 Beeper went off 1416 Pt in ct 1417 Completed 1423

## 2015-03-30 NOTE — ED Provider Notes (Signed)
CSN: [id number]     Arrival date & time 03/30/15  1404 History   First MD Initiated Contact with Patient 03/30/15 1409     Chief Complaint  Patient presents with  . Loss of Consciousness    An emergency department physician performed an initial assessment on this suspected stroke patient at 1416. (Consider location/radiation/quality/duration/timing/severity/associated sxs/prior Treatment) Patient is a 70 y.o. female presenting with syncope. The history is provided by the patient (The patient states that she was having her nails done and she felt very hot and weak and then passed out. Patient states that she had eaten a banana sandwich prior to getting her nails done today).  Loss of Consciousness Episode history:  Single Most recent episode:  Today Timing:  Rare Progression:  Resolved Chronicity:  New Context: not blood draw   Witnessed: yes   Relieved by:  Nothing Associated symptoms: no chest pain, no headaches and no seizures     Past Medical History  Diagnosis Date  . Depression    Past Surgical History  Procedure Laterality Date  . Arthoscopic rotaor cuff repair    . Abdominal hysterectomy    . Leg surgery    . Breast surgery     No family history on file. Social History  Substance Use Topics  . Smoking status: Never Smoker   . Smokeless tobacco: None  . Alcohol Use: No   OB History    No data available     Review of Systems  Constitutional: Negative for appetite change and fatigue.  HENT: Negative for congestion, ear discharge and sinus pressure.   Eyes: Negative for discharge.  Respiratory: Negative for cough.   Cardiovascular: Positive for syncope. Negative for chest pain.  Gastrointestinal: Negative for abdominal pain and diarrhea.  Genitourinary: Negative for frequency and hematuria.  Musculoskeletal: Negative for back pain.  Skin: Negative for rash.  Neurological: Positive for light-headedness. Negative for seizures and headaches.   Psychiatric/Behavioral: Negative for hallucinations.      Allergies  Penicillins  Home Medications   Prior to Admission medications   Medication Sig Start Date End Date Taking? Authorizing Provider  buPROPion (WELLBUTRIN) 75 MG tablet Take 75 mg by mouth daily.     Historical Provider, MD  Chlorpheniramine-Pseudoeph (RINADE-BID PO) Take 2 tablets by mouth daily as needed (acid reflux).    Historical Provider, MD  diphenhydrAMINE (BENADRYL) 25 MG tablet Take 25 mg by mouth every 6 (six) hours as needed for sleep.     Historical Provider, MD  DULoxetine (CYMBALTA) 20 MG capsule Take 20 mg by mouth daily.    Historical Provider, MD  Multiple Vitamins-Minerals (HAIR/SKIN/NAILS PO) Take 1 tablet by mouth daily.    Historical Provider, MD  naproxen sodium (ANAPROX) 220 MG tablet Take 440 mg by mouth daily as needed (pain).    Historical Provider, MD  oxyCODONE (OXY IR/ROXICODONE) 5 MG immediate release tablet Take 5 mg by mouth every 4 (four) hours as needed for severe pain.    Historical Provider, MD   BP 114/85 mmHg  Pulse 70  Temp(Src) 97.6 F (36.4 C) (Oral)  Resp 18  Ht 5\' 8"  (1.727 m)  Wt 197 lb (89.359 kg)  BMI 29.96 kg/m2  SpO2 93% Physical Exam  Constitutional: She is oriented to person, place, and time. She appears well-developed.  HENT:  Head: Normocephalic.  Eyes: Conjunctivae and EOM are normal. No scleral icterus.  Neck: Neck supple. No thyromegaly present.  Cardiovascular: Normal rate and regular rhythm.  Exam reveals  no gallop and no friction rub.   No murmur heard. Pulmonary/Chest: No stridor. She has no wheezes. She has no rales. She exhibits no tenderness.  Abdominal: She exhibits no distension. There is no tenderness. There is no rebound.  Musculoskeletal: Normal range of motion. She exhibits no edema.  Patient had significant weakness in lower extremities she was just barely able to lift her legs off the bed. Only for a split second  Lymphadenopathy:    She  has no cervical adenopathy.  Neurological: She is oriented to person, place, and time. She exhibits normal muscle tone. Coordination normal.  Skin: No rash noted. No erythema.  Psychiatric: She has a normal mood and affect. Her behavior is normal.    ED Course  Procedures (including critical care time) Labs Review Labs Reviewed  COMPREHENSIVE METABOLIC PANEL - Abnormal; Notable for the following:    Glucose, Bld 124 (*)    BUN 25 (*)    Creatinine, Ser 1.07 (*)    GFR calc non Af Amer 52 (*)    All other components within normal limits  I-STAT CHEM 8, ED - Abnormal; Notable for the following:    BUN 26 (*)    Glucose, Bld 124 (*)    Hemoglobin 16.7 (*)    HCT 49.0 (*)    All other components within normal limits  ETHANOL  PROTIME-INR  APTT  CBC WITH DIFFERENTIAL/PLATELET  TROPONIN I  URINE RAPID DRUG SCREEN, HOSP PERFORMED  URINALYSIS, ROUTINE W REFLEX MICROSCOPIC (NOT AT [facility])  D-DIMER, QUANTITATIVE (NOT AT [facility])  Randolm Idol, ED    Imaging Review Ct Head Wo Contrast  03/30/2015  CLINICAL DATA:  Code stroke. EXAM: CT HEAD WITHOUT CONTRAST TECHNIQUE: Contiguous axial images were obtained from the base of the skull through the vertex without intravenous contrast. COMPARISON:  None. FINDINGS: There is no evidence for acute hemorrhage, hydrocephalus, mass lesion, or abnormal extra-axial fluid collection. No definite CT evidence for acute infarction. The visualized paranasal sinuses and mastoid air cells are clear. No evidence for skull fracture. IMPRESSION: Normal head CT. Critical Value/emergent results were called by me at the time of interpretation on 03/30/2015 at 2:31 pm to Dr. Milton Ferguson , who verbally acknowledged these results. Electronically Signed   By: Misty Stanley M.D.   On: 03/30/2015 14:31   Dg Chest Port 1 View  03/30/2015  CLINICAL DATA:  70 year old female with weakness.  Code stroke. EXAM: PORTABLE CHEST 1 VIEW COMPARISON:  Chest x-ray 08/22/2014. FINDINGS:  Lung volumes are normal. No consolidative airspace disease. No pleural effusions. No pneumothorax. No pulmonary nodule or mass noted. Pulmonary vasculature and the cardiomediastinal silhouette are within normal limits. IMPRESSION: No radiographic evidence of acute cardiopulmonary disease. Electronically Signed   By: Vinnie Langton M.D.   On: 03/30/2015 14:49   I have personally reviewed and evaluated these images and lab results as part of my medical decision-making.   EKG Interpretation   Date/Time:  Wednesday March 30 2015 14:06:11 EST Ventricular Rate:  79 PR Interval:  167 QRS Duration: 95 QT Interval:  395 QTC Calculation: 453 R Axis:   38 Text Interpretation:  Sinus rhythm Confirmed by Erminia Mcnew  MD, Broadus John [phone])  on 03/30/2015 3:11:57 PM     Patent seen by telemetry neurology who felt like there is no indications of a stroke. Patient's strength has been coming back. Neurology recommends workup for syncope MDM   Final diagnoses:  Weak      Syncopal episode. Patient given 1  L of fluids and is feeling much better. Lab tests including CT head chest x-ray are normal patient was seen by neurology who felt was just a syncopal episode. Patient would like to go home. She is able to ambulate without problems suspect syncopal episode secondary to not eating much for breakfast dehydration and the fumes from getting her nails done. patient will follow-up with her PCP  Milton Ferguson, MD 03/30/15 1600

## 2015-03-30 NOTE — ED Notes (Signed)
Pt ambulated with NT to and from restroom, says feels " a little better."

## 2015-04-29 ENCOUNTER — Other Ambulatory Visit: Payer: Self-pay | Admitting: General Surgery

## 2015-09-13 ENCOUNTER — Telehealth: Payer: Self-pay | Admitting: Gastroenterology

## 2015-09-13 ENCOUNTER — Telehealth: Payer: Self-pay

## 2015-09-13 ENCOUNTER — Other Ambulatory Visit: Payer: Self-pay

## 2015-09-13 NOTE — Telephone Encounter (Signed)
Patient wants me to call her back in August to set her up for a colonoscopy. I will set a reminder to myself to call her next month

## 2015-09-13 NOTE — Telephone Encounter (Signed)
colonoscopy

## 2015-09-13 NOTE — Telephone Encounter (Signed)
Patient wants me to return her call in August.

## 2015-09-13 NOTE — Telephone Encounter (Signed)
Gastroenterology Pre-Procedure Review  Request Date: 09/26/2015 Requesting Physician:   PATIENT REVIEW QUESTIONS: The patient responded to the following health history questions as indicated:    1. Are you having any GI issues? yes (rectal bleeding, going on for a while) 2. Do you have a personal history of Polyps? yes (benign) 3. Do you have a family history of Colon Cancer or Polyps? yes (maternal uncle, colon cancer ) 4. Diabetes Mellitus? no 5. Joint replacements in the past 12 months?no 6. Major health problems in the past 3 months?no 7. Any artificial heart valves, MVP, or defibrillator?no    MEDICATIONS & ALLERGIES:    Patient reports the following regarding taking any anticoagulation/antiplatelet therapy:   Plavix, Coumadin, Eliquis, Xarelto, Lovenox, Pradaxa, Brilinta, or Effient? no Aspirin? no  Patient confirms/reports the following medications:  Current Outpatient Prescriptions  Medication Sig Dispense Refill  . buPROPion (WELLBUTRIN) 100 MG tablet Take 100 mg by mouth daily.    . DULoxetine (CYMBALTA) 60 MG capsule Take 60 mg by mouth daily.    . Multiple Vitamins-Minerals (HAIR/SKIN/NAILS PO) Take 1 tablet by mouth daily.    . diphenhydrAMINE (BENADRYL) 25 MG tablet Take 25-50 mg by mouth at bedtime as needed for sleep. Reported on 09/13/2015    . oxyCODONE-acetaminophen (PERCOCET/ROXICET) 5-325 MG tablet Take 1 tablet by mouth every 6 (six) hours as needed for moderate pain (*for kidney stones*). Reported on 09/13/2015    . ranitidine (ZANTAC) 75 MG tablet Take 75 mg by mouth daily as needed for heartburn. Reported on 09/13/2015    . VENTOLIN HFA 108 (90 Base) MCG/ACT inhaler Inhale 1-2 puffs into the lungs every 6 (six) hours as needed for wheezing or shortness of breath. Reported on 09/13/2015     No current facility-administered medications for this visit.    Patient confirms/reports the following allergies:  Allergies  Allergen Reactions  . Penicillins     Has  patient had a PCN reaction causing immediate rash, facial/tongue/throat swelling, SOB or lightheadedness with hypotension: Yes Has patient had a PCN reaction causing severe rash involving mucus membranes or skin necrosis: Yes Has patient had a PCN reaction that required hospitalization No Has patient had a PCN reaction occurring within the last 10 years: No If all of the above answers are "NO", then may proceed with Cephalosporin use.     No orders of the defined types were placed in this encounter.    AUTHORIZATION INFORMATION Primary Insurance: 1D#: Group #:  Secondary Insurance: 1D#: Group #:  SCHEDULE INFORMATION: Date: 09/26/2015 Time: Location: [facility]

## 2015-09-16 ENCOUNTER — Other Ambulatory Visit: Payer: Self-pay

## 2015-09-21 ENCOUNTER — Encounter: Payer: Self-pay | Admitting: *Deleted

## 2015-09-23 NOTE — Discharge Instructions (Signed)

## 2015-09-26 ENCOUNTER — Encounter: Payer: Self-pay | Admitting: Anesthesiology

## 2015-09-26 ENCOUNTER — Ambulatory Visit: Payer: Medicare HMO | Admitting: Student in an Organized Health Care Education/Training Program

## 2015-09-26 ENCOUNTER — Encounter
Admission: RE | Disposition: A | Discharge status: Home / Self Care | Payer: Self-pay | Source: Ambulatory Visit | Attending: Gastroenterology

## 2015-09-26 ENCOUNTER — Ambulatory Visit
Admission: RE | Admit: 2015-09-26 | Discharge: 2015-09-26 | Disposition: A | Discharge status: Home / Self Care | Payer: Medicare HMO | Source: Ambulatory Visit | Attending: Gastroenterology | Admitting: Gastroenterology

## 2015-09-26 DIAGNOSIS — Z87442 Personal history of urinary calculi: Secondary | ICD-10-CM | POA: Diagnosis not present

## 2015-09-26 DIAGNOSIS — Z79899 Other long term (current) drug therapy: Secondary | ICD-10-CM | POA: Diagnosis not present

## 2015-09-26 DIAGNOSIS — M1990 Unspecified osteoarthritis, unspecified site: Secondary | ICD-10-CM | POA: Diagnosis not present

## 2015-09-26 DIAGNOSIS — Z90710 Acquired absence of both cervix and uterus: Secondary | ICD-10-CM | POA: Insufficient documentation

## 2015-09-26 DIAGNOSIS — K219 Gastro-esophageal reflux disease without esophagitis: Secondary | ICD-10-CM | POA: Insufficient documentation

## 2015-09-26 DIAGNOSIS — K5730 Diverticulosis of large intestine without perforation or abscess without bleeding: Secondary | ICD-10-CM | POA: Diagnosis not present

## 2015-09-26 DIAGNOSIS — Z880 Allergy status to penicillin: Secondary | ICD-10-CM | POA: Insufficient documentation

## 2015-09-26 DIAGNOSIS — F329 Major depressive disorder, single episode, unspecified: Secondary | ICD-10-CM | POA: Insufficient documentation

## 2015-09-26 DIAGNOSIS — Z9889 Other specified postprocedural states: Secondary | ICD-10-CM | POA: Diagnosis not present

## 2015-09-26 DIAGNOSIS — Z79891 Long term (current) use of opiate analgesic: Secondary | ICD-10-CM | POA: Insufficient documentation

## 2015-09-26 DIAGNOSIS — Z1211 Encounter for screening for malignant neoplasm of colon: Secondary | ICD-10-CM

## 2015-09-26 DIAGNOSIS — K641 Second degree hemorrhoids: Secondary | ICD-10-CM | POA: Diagnosis not present

## 2015-09-26 DIAGNOSIS — D125 Benign neoplasm of sigmoid colon: Secondary | ICD-10-CM | POA: Diagnosis not present

## 2015-09-26 DIAGNOSIS — D122 Benign neoplasm of ascending colon: Secondary | ICD-10-CM | POA: Diagnosis not present

## 2015-09-26 HISTORY — PX: POLYPECTOMY: SHX5525

## 2015-09-26 HISTORY — DX: Calculus of kidney: N20.0

## 2015-09-26 HISTORY — DX: Gastro-esophageal reflux disease without esophagitis: K21.9

## 2015-09-26 HISTORY — DX: Dental restoration status: Z98.811

## 2015-09-26 HISTORY — DX: Unspecified hemorrhoids: K64.9

## 2015-09-26 HISTORY — PX: COLONOSCOPY WITH PROPOFOL: SHX5780

## 2015-09-26 HISTORY — DX: Unspecified osteoarthritis, unspecified site: M19.90

## 2015-09-26 SURGERY — COLONOSCOPY WITH PROPOFOL
Anesthesia: Monitor Anesthesia Care

## 2015-09-26 MED ORDER — PROPOFOL 10 MG/ML IV BOLUS
INTRAVENOUS | Status: DC | PRN
  Administered 2015-09-26: 40 mg via INTRAVENOUS
  Administered 2015-09-26: 100 mg via INTRAVENOUS
  Administered 2015-09-26 (×2): 40 mg via INTRAVENOUS
  Administered 2015-09-26: 50 mg via INTRAVENOUS
  Administered 2015-09-26: 20 mg via INTRAVENOUS
  Administered 2015-09-26: 50 mg via INTRAVENOUS
  Administered 2015-09-26 (×2): 40 mg via INTRAVENOUS

## 2015-09-26 MED ORDER — LIDOCAINE HCL (CARDIAC) 20 MG/ML IV SOLN
INTRAVENOUS | Status: DC | PRN
  Administered 2015-09-26: 50 mg via INTRAVENOUS

## 2015-09-26 MED ORDER — LACTATED RINGERS IV SOLN
INTRAVENOUS | Status: DC
  Administered 2015-09-26: 08:00:00 via INTRAVENOUS

## 2015-09-26 MED ORDER — OXYCODONE HCL 5 MG PO TABS: 5.0000 mg | ORAL_TABLET | Freq: Once | ORAL | Status: DC | PRN

## 2015-09-26 MED ORDER — OXYCODONE HCL 5 MG/5ML PO SOLN: 5.0000 mg | Freq: Once | ORAL | Status: DC | PRN

## 2015-09-26 MED ORDER — STERILE WATER FOR IRRIGATION IR SOLN
Status: DC | PRN
  Administered 2015-09-26 (×2)

## 2015-09-26 SURGICAL SUPPLY — 23 items
CANISTER SUCT 1200ML W/VALVE (MISCELLANEOUS) ×4 IMPLANT
CLIP HMST 235XBRD CATH ROT (MISCELLANEOUS) IMPLANT
CLIP RESOLUTION 360 11X235 (MISCELLANEOUS)
FCP ESCP3.2XJMB 240X2.8X (MISCELLANEOUS)
FORCEPS BIOP RAD 4 LRG CAP 4 (CUTTING FORCEPS) ×4 IMPLANT
FORCEPS BIOP RJ4 240 W/NDL (MISCELLANEOUS)
FORCEPS ESCP3.2XJMB 240X2.8X (MISCELLANEOUS) IMPLANT
GOWN CVR UNV OPN BCK APRN NK (MISCELLANEOUS) ×4 IMPLANT
GOWN ISOL THUMB LOOP REG UNIV (MISCELLANEOUS) ×4
INJECTOR VARIJECT VIN23 (MISCELLANEOUS) IMPLANT
KIT DEFENDO VALVE AND CONN (KITS) IMPLANT
KIT ENDO PROCEDURE OLY (KITS) ×4 IMPLANT
MARKER SPOT ENDO TATTOO 5ML (MISCELLANEOUS) IMPLANT
PAD GROUND ADULT SPLIT (MISCELLANEOUS) ×4 IMPLANT
PROBE APC STR FIRE (PROBE) IMPLANT
RETRIEVER NET ROTH 2.5X230 LF (MISCELLANEOUS) ×4 IMPLANT
SNARE SHORT THROW 13M SML OVAL (MISCELLANEOUS) ×4 IMPLANT
SNARE SHORT THROW 30M LRG OVAL (MISCELLANEOUS) IMPLANT
SNARE SNG USE RND 15MM (INSTRUMENTS) IMPLANT
SPOT EX ENDOSCOPIC TATTOO (MISCELLANEOUS)
TRAP ETRAP POLY (MISCELLANEOUS) ×4 IMPLANT
VARIJECT INJECTOR VIN23 (MISCELLANEOUS)
WATER STERILE IRR 250ML POUR (IV SOLUTION) ×4 IMPLANT

## 2015-09-26 NOTE — Transfer of Care (Signed)
Immediate Anesthesia Transfer of Care Note  Patient: JALISSA WENDT  Procedure(s) Performed: Procedure(s): COLONOSCOPY WITH PROPOFOL (N/A) POLYPECTOMY  Patient Location: PACU  Anesthesia Type: MAC  Level of Consciousness: awake, alert  and patient cooperative  Airway and Oxygen Therapy: Patient Spontanous Breathing and Patient connected to supplemental oxygen  Post-op Assessment: Post-op Vital signs reviewed, Patient's Cardiovascular Status Stable, Respiratory Function Stable, Patent Airway and No signs of Nausea or vomiting  Post-op Vital Signs: Reviewed and stable  Complications: No apparent anesthesia complications

## 2015-09-26 NOTE — Op Note (Addendum)
[facility] Gastroenterology Patient Name: Sharon [name] Procedure Date: 09/26/2015 9:06 AM MRN: [id number] Account #: [id number] Date of Birth: 1945-10-31 Admit Type: Outpatient Age: 70 Room: [facility] OR ROOM 01 Gender: Female Note Status: Supervisor Override Procedure:            Colonoscopy Indications:          Screening for colorectal malignant neoplasm Providers:            Lucilla Lame MD, MD Referring MD:         Danae Orleans (Referring MD) Medicines:            Propofol per Anesthesia Complications:        No immediate complications. Procedure:            Pre-Anesthesia Assessment:                       - Prior to the procedure, a History and Physical was                        performed, and patient medications and allergies were                        reviewed. The patient's tolerance of previous                        anesthesia was also reviewed. The risks and benefits of                        the procedure and the sedation options and risks were                        discussed with the patient. All questions were                        answered, and informed consent was obtained. Prior                        Anticoagulants: The patient has taken no previous                        anticoagulant or antiplatelet agents. ASA Grade                        Assessment: II - A patient with mild systemic disease.                        After reviewing the risks and benefits, the patient was                        deemed in satisfactory condition to undergo the                        procedure.                       After obtaining informed consent, the colonoscope was                        passed under direct vision. Throughout the procedure,  the patient's blood pressure, pulse, and oxygen                        saturations were monitored continuously. The Olympus                        CF-HQ190L Colonoscope (S#. [id number]) was introduced                         through the anus and advanced to the the cecum,                        identified by appendiceal orifice and ileocecal valve.                        The colonoscopy was performed without difficulty. The                        patient tolerated the procedure well. The quality of                        the bowel preparation was excellent. Findings:      The perianal and digital rectal examinations were normal.      A 3 mm polyp was found in the ascending colon. The polyp was sessile.       The polyp was removed with a cold biopsy forceps. Resection and       retrieval were complete.      A 8 mm polyp was found in the sigmoid colon. The polyp was pedunculated.       The polyp was removed with a hot snare. Resection and retrieval were       complete.      Multiple small-mouthed diverticula were found in the sigmoid colon.      Non-bleeding internal hemorrhoids were found during retroflexion. The       hemorrhoids were Grade II (internal hemorrhoids that prolapse but reduce       spontaneously). Impression:           - One 3 mm polyp in the ascending colon, removed with a                        cold biopsy forceps. Resected and retrieved.                       - One 8 mm polyp in the sigmoid colon, removed with a                        hot snare. Resected and retrieved.                       - Diverticulosis in the sigmoid colon.                       - Non-bleeding internal hemorrhoids. Recommendation:       - Await pathology results.                       - Repeat colonoscopy in 5 years if polyp adenoma and 10  years if hyperplastic Procedure Code(s):    --- Professional ---                       [phone], Colonoscopy, flexible; with removal of tumor(s),                        polyp(s), or other lesion(s) by snare technique                       45380, 59, Colonoscopy, flexible; with biopsy, single                        or multiple Diagnosis  Code(s):    --- Professional ---                       D12.5, Benign neoplasm of sigmoid colon                       D12.2, Benign neoplasm of ascending colon                       Z12.11, Encounter for screening for malignant neoplasm                        of colon CPT copyright 2016 American Medical Association. All rights reserved. The codes documented in this report are preliminary and upon coder review may  be revised to meet current compliance requirements. Lucilla Lame MD, MD 09/26/2015 9:45:32 AM This report has been signed electronically. Number of Addenda: 0 Note Initiated On: 09/26/2015 9:06 AM Scope Withdrawal Time: 0 hours 13 minutes 0 seconds  Total Procedure Duration: 0 hours 22 minutes 4 seconds       [facility]

## 2015-09-26 NOTE — Anesthesia Postprocedure Evaluation (Signed)
Anesthesia Post Note  Patient: Sharon [name]  Procedure(s) Performed: Procedure(s) (LRB): COLONOSCOPY WITH PROPOFOL (N/A) POLYPECTOMY  Patient location during evaluation: PACU Anesthesia Type: MAC Level of consciousness: awake and alert Pain management: pain level controlled Vital Signs Assessment: post-procedure vital signs reviewed and stable Respiratory status: spontaneous breathing, nonlabored ventilation, respiratory function stable and patient connected to nasal cannula oxygen Cardiovascular status: stable and blood pressure returned to baseline Anesthetic complications: no    Faustino Luecke

## 2015-09-26 NOTE — H&P (Signed)
  Sharon Lame, MD [facility] [address] [place], [place] [address] Phone: [phone] Fax : [phone]  Primary Care Physician:  No primary care provider on file. Primary Gastroenterologist:  Dr. Allen Norris  Pre-Procedure History & Physical: HPI:  Sharon [name] is a 70 y.o. female is here for a screening colonoscopy.   Past Medical History:  Diagnosis Date  . Arthritis    lower back  . Dental crowns present    caps on top front teeth  . Depression   . GERD (gastroesophageal reflux disease)   . Hemorrhoids   . Kidney stones     Past Surgical History:  Procedure Laterality Date  . ABDOMINAL HYSTERECTOMY    . ARTHOSCOPIC ROTAOR CUFF REPAIR    . BREAST SURGERY    . LEG SURGERY      Prior to Admission medications   Medication Sig Start Date End Date Taking? Authorizing Provider  buPROPion (WELLBUTRIN XL) 150 MG 24 hr tablet  09/12/15  Yes Historical Provider, MD  cyclobenzaprine (FLEXERIL) 5 MG tablet  09/12/15  Yes Historical Provider, MD  DULoxetine (CYMBALTA) 60 MG capsule Take 60 mg by mouth daily. 03/15/15  Yes Historical Provider, MD  Multiple Vitamins-Minerals (HAIR/SKIN/NAILS PO) Take 1 tablet by mouth daily.   Yes Historical Provider, MD  oxyCODONE-acetaminophen (PERCOCET/ROXICET) 5-325 MG tablet Take 1 tablet by mouth every 6 (six) hours as needed for moderate pain (*for kidney stones*). Reported on 09/13/2015 03/19/15  Yes Historical Provider, MD  ranitidine (ZANTAC) 75 MG tablet Take 75 mg by mouth daily as needed for heartburn. Reported on 09/13/2015   Yes Historical Provider, MD    Allergies as of 09/13/2015 - Review Complete 03/30/2015  Allergen Reaction Noted  . Penicillins  03/30/2015    History reviewed. No pertinent family history.  Social History   Social History  . Marital status: Married    Spouse name: N/A  . Number of children: N/A  . Years of education: N/A   Occupational History  . Not on file.   Social History Main Topics  . Smoking  status: Never Smoker  . Smokeless tobacco: Never Used  . Alcohol use No  . Drug use: No  . Sexual activity: Not on file   Other Topics Concern  . Not on file   Social History Narrative  . No narrative on file    Review of Systems: See HPI, otherwise negative ROS  Physical Exam: BP (!) 137/92   Pulse (!) 108   Temp 97.9 F (36.6 C) (Temporal)   Resp 16   Ht 5\' 8"  (1.727 m)   Wt 193 lb (87.5 kg)   SpO2 100%   BMI 29.35 kg/m  General:   Alert,  pleasant and cooperative in NAD Head:  Normocephalic and atraumatic. Neck:  Supple; no masses or thyromegaly. Lungs:  Clear throughout to auscultation.    Heart:  Regular rate and rhythm. Abdomen:  Soft, nontender and nondistended. Normal bowel sounds, without guarding, and without rebound.   Neurologic:  Alert and  oriented x4;  grossly normal neurologically.  Impression/Plan: Sharon [name] is now here to undergo a screening colonoscopy.  Risks, benefits, and alternatives regarding colonoscopy have been reviewed with the patient.  Questions have been answered.  All parties agreeable.

## 2015-09-26 NOTE — Anesthesia Preprocedure Evaluation (Signed)
Anesthesia Evaluation  Patient identified by MRN, date of birth, ID band  Reviewed: NPO status   History of Anesthesia Complications Negative for: history of anesthetic complications  Airway Mallampati: II  TM Distance: >3 FB Neck ROM: full    Dental no notable dental hx.    Pulmonary neg pulmonary ROS,    Pulmonary exam normal        Cardiovascular Exercise Tolerance: Good negative cardio ROS Normal cardiovascular exam     Neuro/Psych Anxiety Depression negative neurological ROS  negative psych ROS   GI/Hepatic Neg liver ROS, GERD  Controlled,  Endo/Other  negative endocrine ROS  Renal/GU negative Renal ROS  negative genitourinary   Musculoskeletal  (+) Arthritis ,   Abdominal   Peds  Hematology negative hematology ROS (+)   Anesthesia Other Findings   Reproductive/Obstetrics                             Anesthesia Physical Anesthesia Plan  ASA: II  Anesthesia Plan: MAC   Post-op Pain Management:    Induction:   Airway Management Planned:   Additional Equipment:   Intra-op Plan:   Post-operative Plan:   Informed Consent: I have reviewed the patients History and Physical, chart, labs and discussed the procedure including the risks, benefits and alternatives for the proposed anesthesia with the patient or authorized representative who has indicated his/her understanding and acceptance.     Plan Discussed with: CRNA  Anesthesia Plan Comments:         Anesthesia Quick Evaluation

## 2015-09-27 ENCOUNTER — Encounter: Payer: Self-pay | Admitting: Gastroenterology

## 2015-09-28 ENCOUNTER — Encounter: Payer: Self-pay | Admitting: Gastroenterology

## 2016-01-10 DIAGNOSIS — Z8551 Personal history of malignant neoplasm of bladder: Secondary | ICD-10-CM | POA: Insufficient documentation

## 2016-04-17 DIAGNOSIS — R911 Solitary pulmonary nodule: Secondary | ICD-10-CM | POA: Insufficient documentation

## 2016-09-24 IMAGING — CT CT HEAD W/O CM
1 series · 16 of 30 positions shown, 20 images · non-contrast
Comparison: None.

CLINICAL DATA: Code stroke.

EXAM:
CT HEAD WITHOUT CONTRAST
TECHNIQUE: Contiguous axial images were obtained from the base of the skull
through the vertex without intravenous contrast.

[Series 2: headtrauma 4.8 h37s · axial · 0.43mm/px · z∈[+82,+242]mm · 16 of 36 slices shown, 20 images]
[im 2/36  brain]
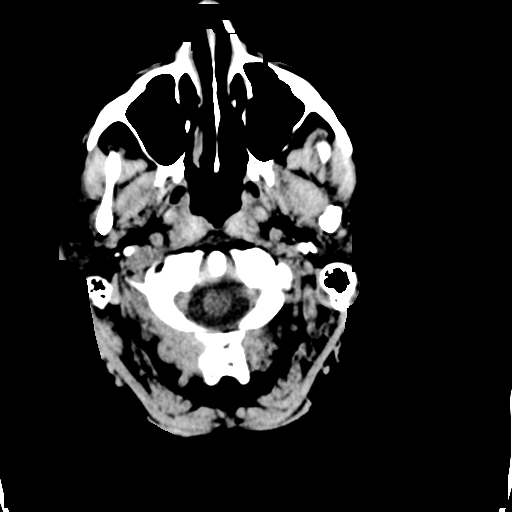
[im 2/36  bone]
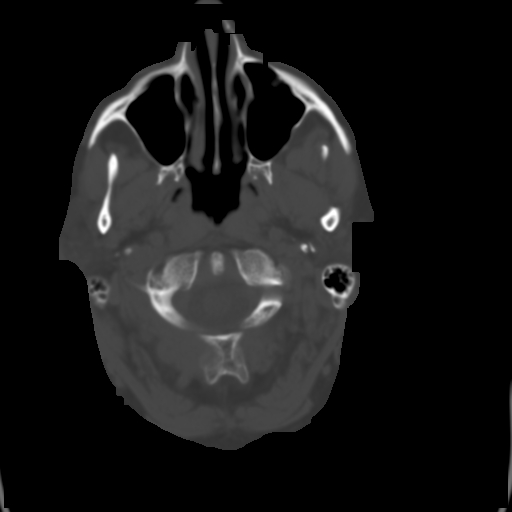
[im 4/36  brain]
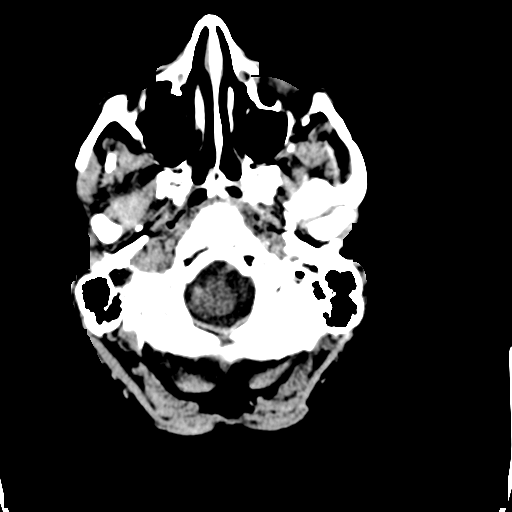
[im 7/36  brain]
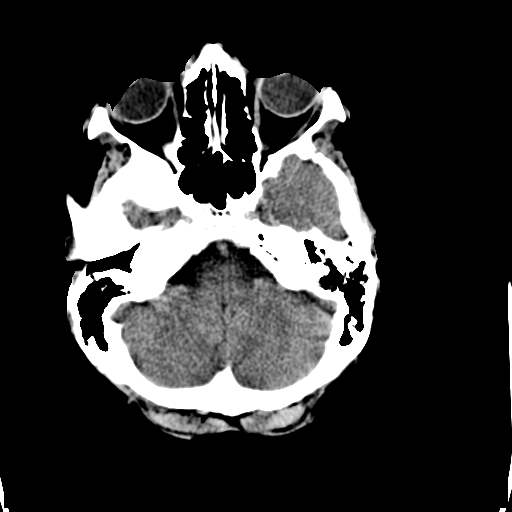
[im 9/36  brain]
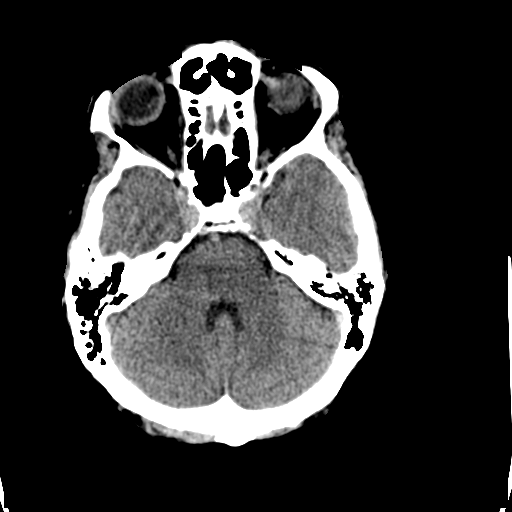
[im 10/36  brain]
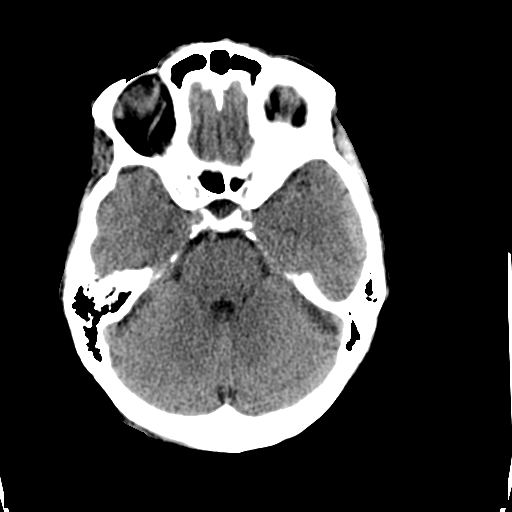
[im 10/36  bone]
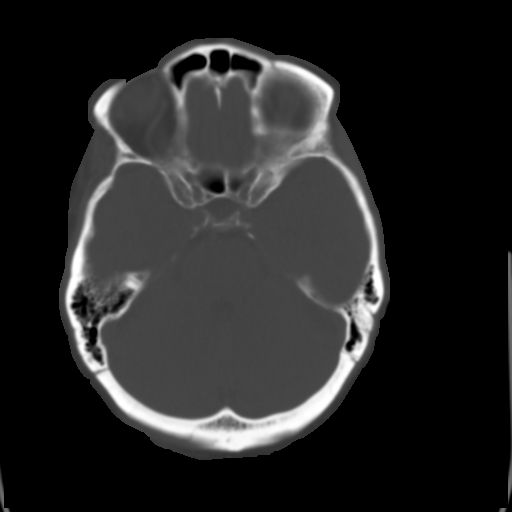
[im 13/36  brain]
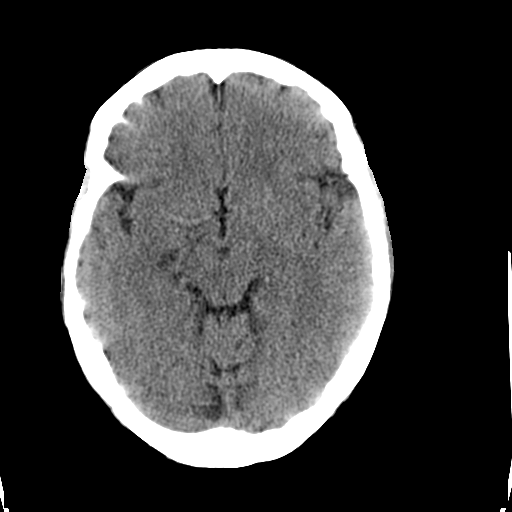
[im 15/36  brain]
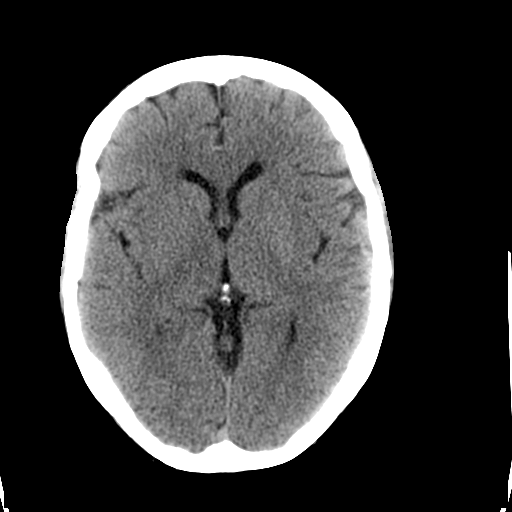
[im 17/36  brain]
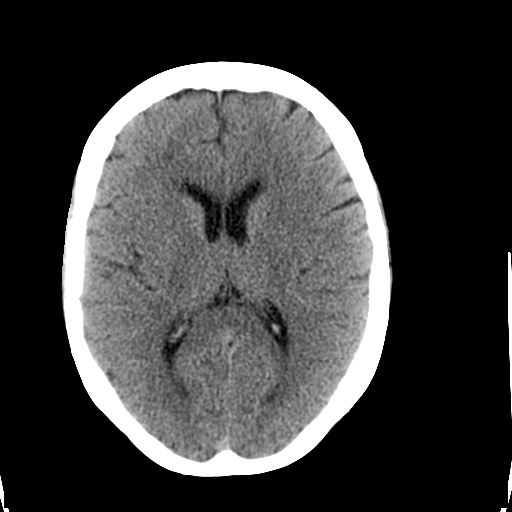
[im 19/36  brain]
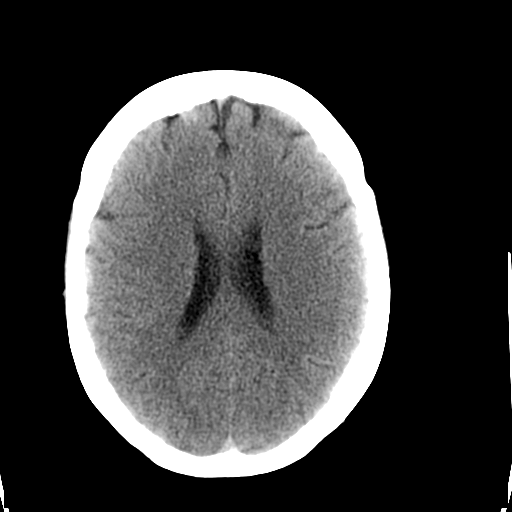
[im 19/36  bone]
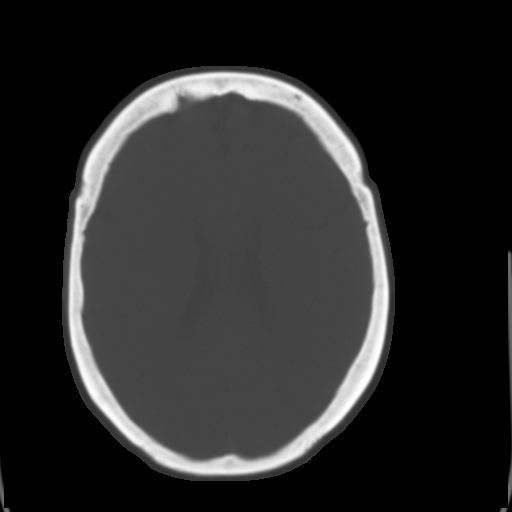
[im 21/36  brain]
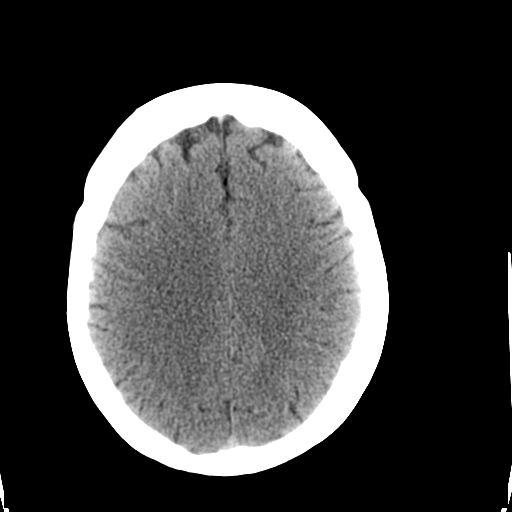
[im 23/36  brain]
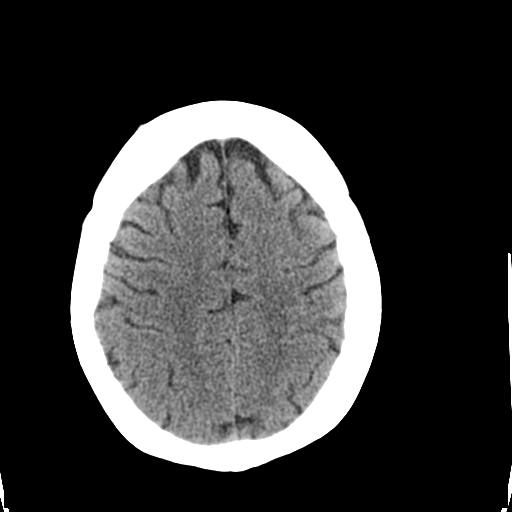
[im 26/36  brain]
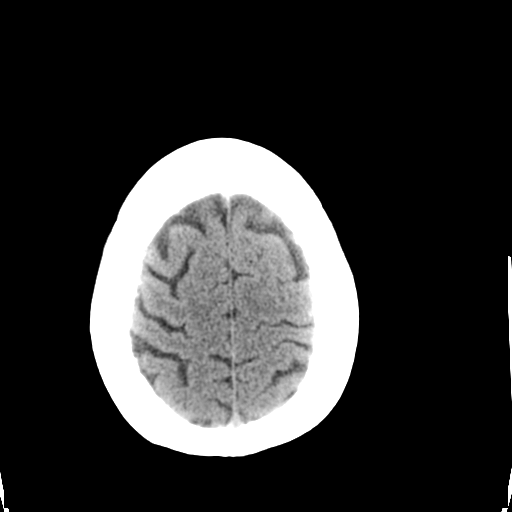
[im 27/36  brain]
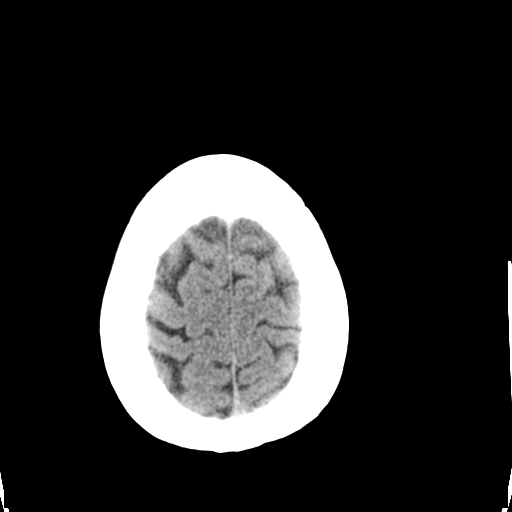
[im 27/36  bone]
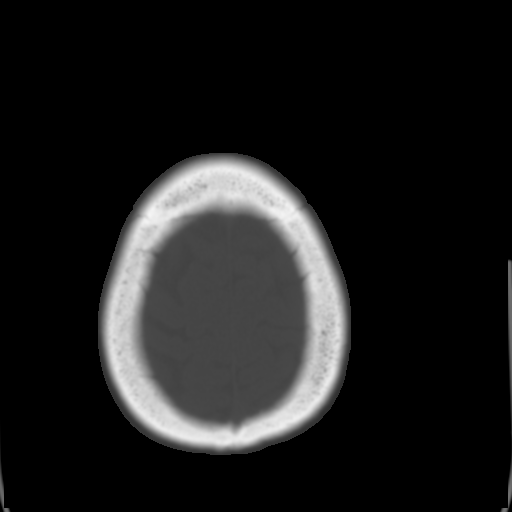
[im 29/36  brain]
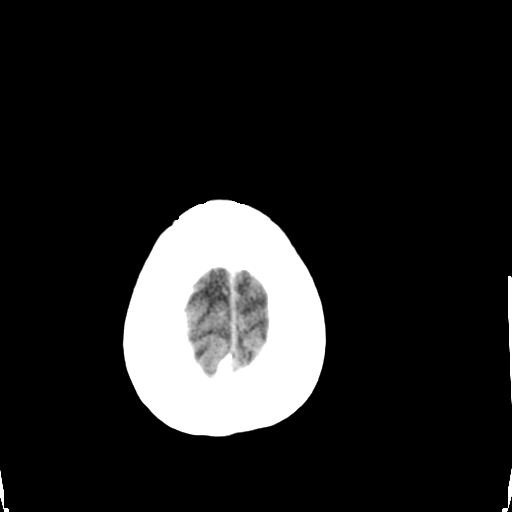
[im 32/36  brain]
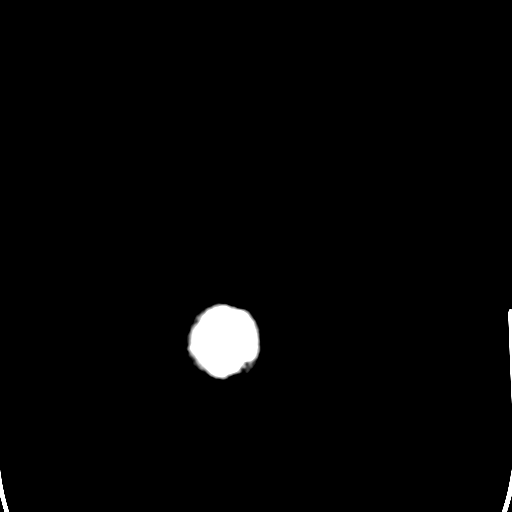
[im 34/36  brain]
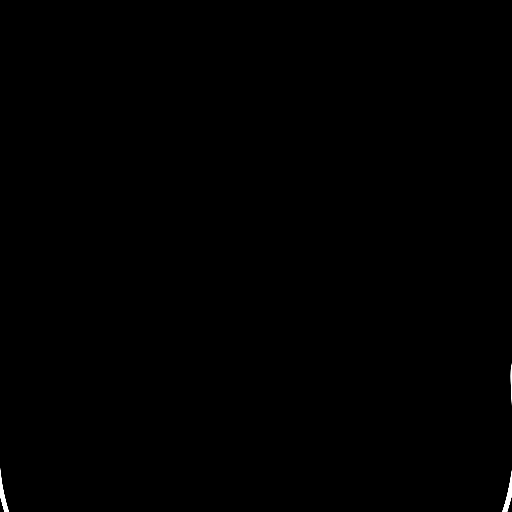

[16 of 30 positions shown; findings below may reference images not displayed]

FINDINGS: There is no evidence for acute hemorrhage, hydrocephalus, mass
lesion, or abnormal extra-axial fluid collection. No definite CT
evidence for acute infarction. The visualized paranasal sinuses and
mastoid air cells are clear. No evidence for skull fracture.
IMPRESSION: Normal head CT.

Critical Value/emergent results were called by me at the time of
interpretation on 03/30/2015 at [DATE] to Dr. ASSISH KNG , who
verbally acknowledged these results.

## 2019-02-21 ENCOUNTER — Encounter: Payer: Self-pay | Admitting: Emergency Medicine

## 2019-02-21 ENCOUNTER — Other Ambulatory Visit: Payer: Self-pay

## 2019-02-21 ENCOUNTER — Ambulatory Visit
Admission: EM | Admit: 2019-02-21 | Discharge: 2019-02-21 | Disposition: A | Discharge status: Home / Self Care | Payer: Medicare HMO

## 2019-02-21 DIAGNOSIS — M654 Radial styloid tenosynovitis [de Quervain]: Secondary | ICD-10-CM

## 2019-02-21 DIAGNOSIS — M79645 Pain in left finger(s): Secondary | ICD-10-CM | POA: Diagnosis not present

## 2019-02-21 MED ORDER — NAPROXEN 375 MG PO TABS: 375.0000 mg | ORAL_TABLET | Freq: Two times a day (BID) | ORAL | 0 refills | Status: AC

## 2019-02-21 NOTE — ED Triage Notes (Signed)
Pt presents to UC w/ c/o left wrist pain x2 weeks.

## 2019-02-21 NOTE — Discharge Instructions (Addendum)
Continue conservative management of rest, ice, and elevation Thumb spica brace placed Take naproxen as needed for pain relief (may cause abdominal discomfort, ulcers, and GI bleeds avoid taking with other NSAIDs) Follow up with PCP if symptoms persist Return or go to the ER if you have any new or worsening symptoms (fever, chills, swelling, bruising, worsening symptoms despite medications, etc...)

## 2019-02-21 NOTE — ED Provider Notes (Signed)
[facility]   [id number] 02/21/19 Arrival Time: 1430  CC: Left wrist/ thumb pain  SUBJECTIVE: History from: patient. Sharon [name] is a 73 y.o. female complains of left wrist/ thumb pain x 1 day.  Denies a precipitating event or specific injury, but does cut hair for a living and admits to frequent repetitive wrist movements.  Localizes the pain to the over base of left thumb and outside of wrist.  Describes the pain as intermittent and achy in character.  Currently being treated for bronchitis with levaquin and prednsione.  Symptoms are made worse with hyperflexion of thumb.  Denies similar symptoms in the past.  Complains of associated "knot" over outside of wrist at based of thumb.  Denies fever, chills, erythema, ecchymosis, weakness, numbness and tingling.  ROS: As per HPI.  All other pertinent ROS negative.     Past Medical History:  Diagnosis Date  . Arthritis    lower back  . Dental crowns present    caps on top front teeth  . Depression   . GERD (gastroesophageal reflux disease)   . Hemorrhoids   . Kidney stones    Past Surgical History:  Procedure Laterality Date  . ABDOMINAL HYSTERECTOMY    . ARTHOSCOPIC ROTAOR CUFF REPAIR    . BREAST SURGERY    . COLONOSCOPY WITH PROPOFOL N/A 09/26/2015   Procedure: COLONOSCOPY WITH PROPOFOL;  Surgeon: Lucilla Lame, MD;  Location: [facility];  Service: Endoscopy;  Laterality: N/A;  . LEG SURGERY    . POLYPECTOMY  09/26/2015   Procedure: POLYPECTOMY;  Surgeon: Lucilla Lame, MD;  Location: [facility];  Service: Endoscopy;;   Allergies  Allergen Reactions  . Penicillins Rash    Has patient had a PCN reaction causing immediate rash, facial/tongue/throat swelling, SOB or lightheadedness with hypotension: Yes Has patient had a PCN reaction causing severe rash involving mucus membranes or skin necrosis: Yes Has patient had a PCN reaction that required hospitalization No Has patient had a PCN reaction occurring  within the last 10 years: No If all of the above answers are "NO", then [name] proceed with Cephalosporin use.    No current facility-administered medications on file prior to encounter.   Current Outpatient Medications on File Prior to Encounter  Medication Sig Dispense Refill  . atorvastatin (LIPITOR) 10 MG tablet Take 10 mg by mouth daily.    Marland Kitchen levofloxacin (LEVAQUIN) 750 MG tablet Take 750 mg by mouth daily.    . methylPREDNISolone (MEDROL DOSEPAK) 4 MG TBPK tablet Take by mouth.    . DULoxetine (CYMBALTA) 60 MG capsule Take 60 mg by mouth daily.    . Multiple Vitamins-Minerals (HAIR/SKIN/NAILS PO) Take 1 tablet by mouth daily.    Marland Kitchen oxyCODONE-acetaminophen (PERCOCET/ROXICET) 5-325 MG tablet Take 1 tablet by mouth every 6 (six) hours as needed for moderate pain (*for kidney stones*). Reported on 09/13/2015    . ranitidine (ZANTAC) 75 MG tablet Take 75 mg by mouth daily as needed for heartburn. Reported on 09/13/2015    . [DISCONTINUED] buPROPion (WELLBUTRIN XL) 150 MG 24 hr tablet      Social History   Socioeconomic History  . Marital status: Married    Spouse name: Not on file  . Number of children: Not on file  . Years of education: Not on file  . Highest education level: Not on file  Occupational History  . Not on file  Tobacco Use  . Smoking status: Never Smoker  . Smokeless tobacco: Never Used  Substance and Sexual Activity  . Alcohol use: No  . Drug use: No  . Sexual activity: Not on file  Other Topics Concern  . Not on file  Social History Narrative  . Not on file   Social Determinants of Health   Financial Resource Strain:   . Difficulty of Paying Living Expenses: Not on file  Food Insecurity:   . Worried About Charity fundraiser in the Last Year: Not on file  . Ran Out of Food in the Last Year: Not on file  Transportation Needs:   . Lack of Transportation (Medical): Not on file  . Lack of Transportation (Non-Medical): Not on file  Physical Activity:   . Days  of Exercise per Week: Not on file  . Minutes of Exercise per Session: Not on file  Stress:   . Feeling of Stress : Not on file  Social Connections:   . Frequency of Communication with Friends and Family: Not on file  . Frequency of Social Gatherings with Friends and Family: Not on file  . Attends Religious Services: Not on file  . Active Member of Clubs or Organizations: Not on file  . Attends Archivist Meetings: Not on file  . Marital Status: Not on file  Intimate Partner Violence:   . Fear of Current or Ex-Partner: Not on file  . Emotionally Abused: Not on file  . Physically Abused: Not on file  . Sexually Abused: Not on file   History reviewed. No pertinent family history.  OBJECTIVE: BP: 158/97, HR: 76 bpm, O2 Sat: 95%, Temp: 98.4 F  General appearance: ALERT; in no acute distress.  Head: NCAT Lungs: Normal respiratory effort CV: Radial pulses 2+ bilaterally. Cap refill < 2 seconds Musculoskeletal: left wrist Inspection: Skin warm, dry, clear and intact without obvious erythema, effusion, or ecchymosis.  Palpation: snuff [name] tenderness; TTP over lateral base of thumb and lateral wrist ROM: FROM active and passive about the thumb Skin: warm and dry Neurologic: Ambulates without difficulty; Sensation intact about the upper extremities Psychological: alert and cooperative; normal mood and affect  ASSESSMENT & PLAN:  1. Thumb pain, left   2. Tendinitis, de Quervain's    Meds ordered this encounter  Medications  . naproxen (NAPROSYN) 375 MG tablet    Sig: Take 1 tablet (375 mg total) by mouth 2 (two) times daily for 5 days.    Dispense:  10 tablet    Refill:  0    Order Specific Question:   Supervising Provider    Answer:   Raylene Everts [id number]    Continue conservative management of rest, ice, and elevation Thumb spica brace placed Take naproxen as needed for pain relief ([name] cause abdominal discomfort, ulcers, and GI bleeds avoid taking with other  NSAIDs) Follow up with PCP if symptoms persist Return or go to the ER if you have any new or worsening symptoms (fever, chills, swelling, bruising, worsening symptoms despite medications, etc...)   Will return tomorrow if symptoms persists for x-ray.    Reviewed expectations re: course of current medical issues. Questions answered. Outlined signs and symptoms indicating need for more acute intervention. Patient verbalized understanding. After Visit Summary given.    Sharon Box, PA-C 02/21/19 1619

## 2019-02-25 ENCOUNTER — Emergency Department (HOSPITAL_COMMUNITY): Payer: Medicare HMO

## 2019-02-25 ENCOUNTER — Emergency Department (HOSPITAL_COMMUNITY)
Admission: EM | Admit: 2019-02-25 | Discharge: 2019-02-25 | Disposition: A | Discharge status: Home / Self Care | Payer: Medicare HMO | Attending: Emergency Medicine | Admitting: Emergency Medicine

## 2019-02-25 ENCOUNTER — Other Ambulatory Visit: Payer: Self-pay

## 2019-02-25 ENCOUNTER — Ambulatory Visit: Payer: Medicare HMO

## 2019-02-25 ENCOUNTER — Encounter (HOSPITAL_COMMUNITY): Payer: Self-pay | Admitting: Emergency Medicine

## 2019-02-25 DIAGNOSIS — R05 Cough: Secondary | ICD-10-CM | POA: Diagnosis present

## 2019-02-25 DIAGNOSIS — R0602 Shortness of breath: Secondary | ICD-10-CM | POA: Insufficient documentation

## 2019-02-25 DIAGNOSIS — Z20822 Contact with and (suspected) exposure to covid-19: Secondary | ICD-10-CM

## 2019-02-25 DIAGNOSIS — Z5321 Procedure and treatment not carried out due to patient leaving prior to being seen by health care provider: Secondary | ICD-10-CM | POA: Insufficient documentation

## 2019-02-25 NOTE — ED Triage Notes (Signed)
Attempted multiple times to get patient to stay for medical assessment. Patient refuses, wants to sign out AMA.

## 2019-02-25 NOTE — Addendum Note (Signed)
Addended by: Marvene Staff on: 02/25/2019 02:34 PM   Modules accepted: Orders

## 2019-02-25 NOTE — Addendum Note (Signed)
Addended by: Marvene Staff on: 02/25/2019 01:35 PM   Modules accepted: Orders

## 2019-02-25 NOTE — ED Triage Notes (Signed)
Patient reports cough, SOB, loss of taste, headache, and generalized body aches with weakness. Symptoms started Monday. Patient had a positive exposure on 12/24.

## 2019-02-26 LAB — NOVEL CORONAVIRUS, NAA: SARS-CoV-2, NAA: DETECTED — AB

## 2019-09-07 DIAGNOSIS — E785 Hyperlipidemia, unspecified: Secondary | ICD-10-CM | POA: Insufficient documentation

## 2019-09-07 DIAGNOSIS — E559 Vitamin D deficiency, unspecified: Secondary | ICD-10-CM | POA: Insufficient documentation

## 2021-07-16 ENCOUNTER — Ambulatory Visit
Admission: EM | Admit: 2021-07-16 | Discharge: 2021-07-16 | Disposition: A | Discharge status: Home / Self Care | Payer: Medicare HMO | Attending: Family Medicine | Admitting: Family Medicine

## 2021-07-16 DIAGNOSIS — J209 Acute bronchitis, unspecified: Secondary | ICD-10-CM

## 2021-07-16 MED ORDER — PREDNISONE 20 MG PO TABS: 40.0000 mg | ORAL_TABLET | Freq: Every day | ORAL | 0 refills | Status: DC

## 2021-07-16 MED ORDER — PROMETHAZINE-DM 6.25-15 MG/5ML PO SYRP: 5.0000 mL | ORAL_SOLUTION | Freq: Four times a day (QID) | ORAL | 0 refills | Status: DC | PRN

## 2021-07-16 MED ORDER — ALBUTEROL SULFATE HFA 108 (90 BASE) MCG/ACT IN AERS: 1.0000 | INHALATION_SPRAY | Freq: Four times a day (QID) | RESPIRATORY_TRACT | 0 refills | Status: DC | PRN

## 2021-07-16 NOTE — ED Provider Notes (Signed)
[facility]    CSN: [id number] Arrival date & time: 07/16/21  1336      History   Chief Complaint Chief Complaint  Patient presents with   Cough    HPI Sharon [name] is a 76 y.o. female.   Presenting today with a month of off-and-on chest tightness, wheezing, hacking dry cough.  States she tends to get bronchitis fairly frequently and usually gets a steroid course and inhaler to get rid of it.  Was not able to see her typical doctor with this so tried some Mucinex with no relief.  No known diagnosed asthma, COPD.   Past Medical History:  Diagnosis Date   Arthritis    lower back   Dental crowns present    caps on top front teeth   Depression    GERD (gastroesophageal reflux disease)    Hemorrhoids    Kidney stones     Patient Active Problem List   Diagnosis Date Noted   Special screening for malignant neoplasms, colon    Benign neoplasm of ascending colon    Benign neoplasm of sigmoid colon    Mixed incontinence 03/16/2014   Calculus of kidney 82/50/5397   Renal colic 67/34/1937   Pain in shoulder 11/23/2013   Colon polyp 11/10/2013   Cough 11/10/2013   Clinical depression 11/10/2013   Bloodgood disease 11/10/2013    Past Surgical History:  Procedure Laterality Date   ABDOMINAL HYSTERECTOMY     ARTHOSCOPIC ROTAOR CUFF REPAIR     BREAST SURGERY     COLONOSCOPY WITH PROPOFOL N/A 09/26/2015   Procedure: COLONOSCOPY WITH PROPOFOL;  Surgeon: Lucilla Lame, MD;  Location: [facility];  Service: Endoscopy;  Laterality: N/A;   LEG SURGERY     POLYPECTOMY  09/26/2015   Procedure: POLYPECTOMY;  Surgeon: Lucilla Lame, MD;  Location: [facility];  Service: Endoscopy;;    OB History     Gravida  1   Para  1   Term  1   Preterm      AB      Living         SAB      IAB      Ectopic      Multiple      Live Births               Home Medications    Prior to Admission medications   Medication Sig Start Date End Date  Taking? Authorizing Provider  albuterol (VENTOLIN HFA) 108 (90 Base) MCG/ACT inhaler Inhale 1-2 puffs into the lungs every 6 (six) hours as needed for wheezing or shortness of breath. 07/16/21  Yes Volney American, PA-C  predniSONE (DELTASONE) 20 MG tablet Take 2 tablets (40 mg total) by mouth daily with breakfast. 07/16/21  Yes Volney American, PA-C  promethazine-dextromethorphan (PROMETHAZINE-DM) 6.25-15 MG/5ML syrup Take 5 mLs by mouth 4 (four) times daily as needed. 07/16/21  Yes Volney American, PA-C  atorvastatin (LIPITOR) 10 MG tablet Take 10 mg by mouth daily.    [provider]  DULoxetine (CYMBALTA) 60 MG capsule Take 60 mg by mouth daily. 03/15/15   [provider]  levofloxacin (LEVAQUIN) 750 MG tablet Take 750 mg by mouth daily.    [provider]  methylPREDNISolone (MEDROL DOSEPAK) 4 MG TBPK tablet Take by mouth.    [provider]  Multiple Vitamins-Minerals (HAIR/SKIN/NAILS PO) Take 1 tablet by mouth daily.    [provider]  oxyCODONE-acetaminophen (PERCOCET/ROXICET)  5-325 MG tablet Take 1 tablet by mouth every 6 (six) hours as needed for moderate pain (*for kidney stones*). Reported on 09/13/2015 03/19/15   [provider]  ranitidine (ZANTAC) 75 MG tablet Take 75 mg by mouth daily as needed for heartburn. Reported on 09/13/2015    [provider]  buPROPion (WELLBUTRIN XL) 150 MG 24 hr tablet  09/12/15 02/21/19  [provider]    Family History Family History  Problem Relation Age of Onset   Healthy Mother    Healthy Father     Social History Social History   Tobacco Use   Smoking status: Never   Smokeless tobacco: Never  Vaping Use   Vaping Use: Never used  Substance Use Topics   Alcohol use: No   Drug use: No     Allergies   Penicillins   Review of Systems Review of Systems Per HPI  Physical Exam Triage Vital Signs ED Triage Vitals  Enc Vitals Group     BP  07/16/21 1353 114/74     Pulse Rate 07/16/21 1353 (!) 116     Resp 07/16/21 1353 16     Temp 07/16/21 1353 98.9 F (37.2 C)     Temp Source 07/16/21 1353 Oral     SpO2 07/16/21 1353 94 %     Weight --      Height --      Head Circumference --      Peak Flow --      Pain Score 07/16/21 1351 8     Pain Loc --      Pain Edu? --      Excl. in [place]? --    No data found.  Updated Vital Signs BP 114/74 (BP Location: Right Arm)   Pulse (!) 116   Temp 98.9 F (37.2 C) (Oral)   Resp 16   SpO2 94%   Visual Acuity Right Eye Distance:   Left Eye Distance:   Bilateral Distance:    Right Eye Near:   Left Eye Near:    Bilateral Near:     Physical Exam Vitals and nursing note reviewed.  Constitutional:      Appearance: Normal appearance.  HENT:     Head: Atraumatic.     Right Ear: Tympanic membrane and external ear normal.     Left Ear: Tympanic membrane and external ear normal.     Mouth/Throat:     Mouth: Mucous membranes are moist.     Pharynx: Posterior oropharyngeal erythema present.  Eyes:     Extraocular Movements: Extraocular movements intact.     Conjunctiva/sclera: Conjunctivae normal.  Cardiovascular:     Rate and Rhythm: Normal rate and regular rhythm.     Heart sounds: Normal heart sounds.  Pulmonary:     Effort: Pulmonary effort is normal.     Breath sounds: Wheezing present. No rales.     Comments: Trace wheezes bilaterally Musculoskeletal:        General: Normal range of motion.     Cervical back: Normal range of motion and neck supple.  Skin:    General: Skin is warm and dry.  Neurological:     Mental Status: She is alert and oriented to person, place, and time.  Psychiatric:        Mood and Affect: Mood normal.        Thought Content: Thought content normal.   UC Treatments / Results  Labs (all labs ordered are listed, but only abnormal results  are displayed) Labs Reviewed - No data to display  EKG   Radiology No results  found.  Procedures Procedures (including critical care time)  Medications Ordered in UC Medications - No data to display  Initial Impression / Assessment and Plan / UC Course  I have reviewed the triage vital signs and the nursing notes.  Pertinent labs & imaging results that were available during my care of the patient were reviewed by me and considered in my medical decision making (see chart for details).     Treat with prednisone, Phenergan DM, albuterol inhaler.  Continue Mucinex as needed.  Follow-up with PCP for recheck.  Return sooner for worsening symptoms.  Final Clinical Impressions(s) / UC Diagnoses   Final diagnoses:  Acute bronchitis, unspecified organism   Discharge Instructions   None    ED Prescriptions     Medication Sig Dispense Auth. Provider   predniSONE (DELTASONE) 20 MG tablet Take 2 tablets (40 mg total) by mouth daily with breakfast. 14 tablet Volney American, PA-C   promethazine-dextromethorphan (PROMETHAZINE-DM) 6.25-15 MG/5ML syrup Take 5 mLs by mouth 4 (four) times daily as needed. 100 mL Volney American, PA-C   albuterol (VENTOLIN HFA) 108 (90 Base) MCG/ACT inhaler Inhale 1-2 puffs into the lungs every 6 (six) hours as needed for wheezing or shortness of breath. 18 g Volney American, [place]      PDMP not reviewed this encounter.   Volney American, [place] 07/16/21 1457

## 2021-07-16 NOTE — ED Triage Notes (Signed)
Pt states about a month ago she had some Bronchitis issues  Pt states she tried some OTC meds but that has not helped  Pt states she is unable to get an appointment with her PCP  Denies Fever

## 2021-07-24 ENCOUNTER — Ambulatory Visit
Admission: EM | Admit: 2021-07-24 | Discharge: 2021-07-24 | Disposition: A | Discharge status: Home / Self Care | Payer: Medicare HMO | Attending: Family Medicine | Admitting: Family Medicine

## 2021-07-24 DIAGNOSIS — J22 Unspecified acute lower respiratory infection: Secondary | ICD-10-CM | POA: Diagnosis not present

## 2021-07-24 MED ORDER — LEVOFLOXACIN 750 MG PO TABS: 750.0000 mg | ORAL_TABLET | Freq: Every day | ORAL | 0 refills | Status: DC

## 2021-07-24 MED ORDER — FLUCONAZOLE 150 MG PO TABS: 150.0000 mg | ORAL_TABLET | ORAL | 0 refills | Status: DC

## 2021-07-24 MED ORDER — PREDNISONE 20 MG PO TABS: 40.0000 mg | ORAL_TABLET | Freq: Every day | ORAL | 0 refills | Status: AC

## 2021-07-24 NOTE — ED Triage Notes (Signed)
Pt states her cough is still no better since 07/16/21.   Pt states she has coughed for 3 weeks  Denies Fever

## 2021-07-24 NOTE — ED Provider Notes (Signed)
[facility]    CSN: [id number] Arrival date & time: 07/24/21  1414      History   Chief Complaint Chief Complaint  Patient presents with   Cough    HPI Sharon [name] is a 76 y.o. female.   Presenting today with persistent and worsening hacking cough, wheezing, chest tightness, shortness of breath for the past 3 weeks.  Was seen 07/16/2021 and given prednisone, cough syrup and albuterol inhaler which she states did not help.  She states in the past when she has had this the only thing that is helped has been Levaquin and prednisone.  Denies fever, chills, abdominal pain, nausea vomiting or diarrhea.   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    Past Surgical History:  Procedure Laterality Date   ABDOMINAL HYSTERECTOMY     ARTHOSCOPIC ROTAOR CUFF REPAIR     BREAST SURGERY     COLONOSCOPY WITH PROPOFOL N/A 09/26/2015   Procedure: COLONOSCOPY WITH PROPOFOL;  Surgeon: Lucilla Lame, MD;  Location: [facility];  Service: Endoscopy;  Laterality: N/A;   LEG SURGERY     POLYPECTOMY  09/26/2015   Procedure: POLYPECTOMY;  Surgeon: Lucilla Lame, MD;  Location: [facility];  Service: Endoscopy;;    OB History     Gravida  1   Para  1   Term  1   Preterm      AB      Living         SAB      IAB      Ectopic      Multiple      Live Births               Home  Medications    Prior to Admission medications   Medication Sig Start Date End Date Taking? Authorizing Provider  fluconazole (DIFLUCAN) 150 MG tablet Take 1 tablet (150 mg total) by mouth every other day. 07/24/21  Yes Volney American, PA-C  levofloxacin (LEVAQUIN) 750 MG tablet Take 1 tablet (750 mg total) by mouth daily. 07/24/21  Yes Volney American, PA-C  predniSONE (DELTASONE) 20 MG tablet Take 2 tablets (40 mg total) by mouth daily with breakfast for 5 days. 07/24/21 07/29/21 Yes Volney American, PA-C  albuterol (VENTOLIN HFA) 108 (90 Base) MCG/ACT inhaler Inhale 1-2 puffs into the lungs every 6 (six) hours as needed for wheezing or shortness of breath. 07/16/21   Volney American, PA-C  atorvastatin (LIPITOR) 10 MG tablet Take 10 mg by mouth daily.    [provider]  DULoxetine (CYMBALTA) 60 MG capsule Take 60 mg by mouth daily. 03/15/15   [provider]  levofloxacin (LEVAQUIN) 750 MG tablet Take 750 mg by mouth daily.    [provider]  methylPREDNISolone (  MEDROL DOSEPAK) 4 MG TBPK tablet Take by mouth.    [provider]  Multiple Vitamins-Minerals (HAIR/SKIN/NAILS PO) Take 1 tablet by mouth daily.    [provider]  oxyCODONE-acetaminophen (PERCOCET/ROXICET) 5-325 MG tablet Take 1 tablet by mouth every 6 (six) hours as needed for moderate pain (*for kidney stones*). Reported on 09/13/2015 03/19/15   [provider]  predniSONE (DELTASONE) 20 MG tablet Take 2 tablets (40 mg total) by mouth daily with breakfast. 07/16/21   Volney American, PA-C  promethazine-dextromethorphan (PROMETHAZINE-DM) 6.25-15 MG/5ML syrup Take 5 mLs by mouth 4 (four) times daily as needed. 07/16/21   Volney American, PA-C  ranitidine (ZANTAC) 75 MG tablet Take 75 mg by mouth daily as needed for heartburn. Reported on 09/13/2015    [provider]  buPROPion (WELLBUTRIN XL) 150 MG 24 hr tablet  09/12/15 02/21/19  [provider]    Family History Family History  Problem Relation Age of Onset   Healthy Mother    Healthy Father     Social History Social History   Tobacco Use   Smoking status: Never   Smokeless tobacco: Never  Vaping Use   Vaping Use: Never used  Substance Use Topics   Alcohol use: No   Drug use: No     Allergies   Penicillins   Review of Systems Review of Systems Per HPI  Physical Exam Triage Vital Signs ED Triage Vitals  Enc Vitals Group     BP 07/24/21 1505 (!) 177/99     Pulse Rate 07/24/21 1505 (!) 107     Resp 07/24/21 1505 18     Temp 07/24/21 1505 98.3 F (36.8 C)     Temp Source 07/24/21 1505 Tympanic     SpO2 07/24/21 1505 96 %     Weight --      Height --      Head Circumference --      Peak Flow --      Pain Score 07/24/21 1507 0     Pain Loc --      Pain Edu? --      Excl. in [place]? --    No data found.  Updated Vital Signs BP (!) 177/99 (BP Location: Right Arm)   Pulse (!) 107   Temp 98.3 F (36.8 C) (Tympanic)   Resp 18   SpO2 96%   Visual Acuity Right Eye Distance:   Left Eye Distance:   Bilateral Distance:    Right Eye Near:   Left Eye Near:    Bilateral Near:     Physical Exam Vitals and nursing note reviewed.  Constitutional:      Appearance: Normal appearance.  HENT:     Head: Atraumatic.     Right Ear: Tympanic membrane and external ear normal.     Left Ear: Tympanic membrane and external ear normal.     Nose: Nose normal.     Mouth/Throat:     Mouth: Mucous membranes are moist.     Pharynx: Posterior oropharyngeal erythema present.  Eyes:     Extraocular Movements: Extraocular movements intact.     Conjunctiva/sclera: Conjunctivae normal.  Cardiovascular:     Rate and Rhythm: Normal rate and regular rhythm.     Heart sounds: Normal heart sounds.  Pulmonary:     Effort: Pulmonary effort is normal.     Breath sounds: Wheezing present. No rales.     Comments: Moderate wheezes diffusely Musculoskeletal:  General: Normal range of motion.     Cervical back: Normal range of motion and neck supple.  Skin:    General: Skin is warm and dry.  Neurological:     Mental Status: She is alert and oriented to person, place, and time.  Psychiatric:        Mood and Affect: Mood normal.        Thought Content: Thought content normal.   UC Treatments / Results  Labs (all labs ordered are listed, but only abnormal results are displayed) Labs Reviewed - No data to display  EKG  Radiology No results found.  Procedures Procedures (including critical care time)  Medications Ordered in UC Medications - No data to display  Initial Impression / Assessment and Plan / UC Course  I have reviewed the triage vital signs and the nursing notes.  Pertinent labs & imaging results that were available during my care of the patient were reviewed by me and considered in my medical decision making (see chart for details).     Given progression, worsening course will treat with Levaquin, prednisone,Diflucan sent as she states these medications give her a yeast infection.  Close follow-up with PCP recommended for lung recheck.  Final Clinical Impressions(s) / UC Diagnoses   Final diagnoses:  Lower respiratory infection   Discharge Instructions   None    ED Prescriptions     Medication Sig Dispense Auth. Provider   levofloxacin (LEVAQUIN) 750 MG tablet Take 1 tablet (750 mg total) by mouth daily. 7 tablet Volney American, [place]   predniSONE (DELTASONE) 20 MG tablet Take 2 tablets (40 mg total) by mouth daily with breakfast for 5 days. 10 tablet Volney American, [place]   fluconazole (DIFLUCAN) 150 MG tablet Take 1 tablet (150 mg total) by mouth every other day. 3 tablet Volney American, [place]      PDMP not reviewed this encounter.   Volney American, [place] 07/24/21 1552

## 2021-08-22 DIAGNOSIS — G4700 Insomnia, unspecified: Secondary | ICD-10-CM | POA: Insufficient documentation

## 2021-08-23 ENCOUNTER — Telehealth: Payer: Self-pay

## 2021-08-23 NOTE — Telephone Encounter (Signed)
Colonoscopy Referral received. Left message with patients husband to have his wife call me back in regards to referral.  Scheduling note: History of colon polyps last colonoscopy was with Dr. Allen Norris at [facility] 09/26/15.  Polyps were noted.  Thanks,  [place], [place]

## 2021-08-24 ENCOUNTER — Telehealth: Payer: Self-pay

## 2021-08-24 NOTE — Telephone Encounter (Signed)
Called no answer no voicemail

## 2021-08-25 ENCOUNTER — Telehealth: Payer: Self-pay

## 2021-08-25 ENCOUNTER — Other Ambulatory Visit: Payer: Self-pay

## 2021-08-25 DIAGNOSIS — Z86010 Personal history of colonic polyps: Secondary | ICD-10-CM

## 2021-08-25 MED ORDER — CLENPIQ 10-3.5-12 MG-GM -GM/160ML PO SOLN: 1.0000 | Freq: Once | ORAL | 0 refills | Status: AC

## 2021-08-25 NOTE — Telephone Encounter (Signed)
Called no answer no voicemail letter sent ?

## 2021-08-25 NOTE — Telephone Encounter (Signed)
Gastroenterology Pre-Procedure Review  Request Date: 09/25/21 Requesting Physician: Dr. Allen Norris  PATIENT REVIEW QUESTIONS: The patient responded to the following health history questions as indicated:    1. Are you having any GI issues? no 2. Do you have a personal history of Polyps? yes (2017 colonoscopy performed by Dr. Allen Norris at [facility] polyps noted) 3. Do you have a family history of Colon Cancer or Polyps? no 4. Diabetes Mellitus? no 5. Joint replacements in the past 12 months?no 6. Major health problems in the past 3 months?no 7. Any artificial heart valves, MVP, or defibrillator?no    MEDICATIONS & ALLERGIES:    Patient reports the following regarding taking any anticoagulation/antiplatelet therapy:   Plavix, Coumadin, Eliquis, Xarelto, Lovenox, Pradaxa, Brilinta, or Effient? no Aspirin? no  Patient confirms/reports the following medications:  Current Outpatient Medications  Medication Sig Dispense Refill   albuterol (VENTOLIN HFA) 108 (90 Base) MCG/ACT inhaler Inhale 1-2 puffs into the lungs every 6 (six) hours as needed for wheezing or shortness of breath. 18 g 0   atorvastatin (LIPITOR) 10 MG tablet Take 10 mg by mouth daily.     DULoxetine (CYMBALTA) 60 MG capsule Take 60 mg by mouth daily.     fluconazole (DIFLUCAN) 150 MG tablet Take 1 tablet (150 mg total) by mouth every other day. 3 tablet 0   levofloxacin (LEVAQUIN) 750 MG tablet Take 750 mg by mouth daily.     levofloxacin (LEVAQUIN) 750 MG tablet Take 1 tablet (750 mg total) by mouth daily. 7 tablet 0   methylPREDNISolone (MEDROL DOSEPAK) 4 MG TBPK tablet Take by mouth.     Multiple Vitamins-Minerals (HAIR/SKIN/NAILS PO) Take 1 tablet by mouth daily.     oxyCODONE-acetaminophen (PERCOCET/ROXICET) 5-325 MG tablet Take 1 tablet by mouth every 6 (six) hours as needed for moderate pain (*for kidney stones*). Reported on 09/13/2015     predniSONE (DELTASONE) 20 MG tablet Take 2 tablets (40 mg total) by mouth daily with breakfast.  14 tablet 0   promethazine-dextromethorphan (PROMETHAZINE-DM) 6.25-15 MG/5ML syrup Take 5 mLs by mouth 4 (four) times daily as needed. 100 mL 0   ranitidine (ZANTAC) 75 MG tablet Take 75 mg by mouth daily as needed for heartburn. Reported on 09/13/2015     No current facility-administered medications for this visit.    Patient confirms/reports the following allergies:  Allergies  Allergen Reactions   Penicillins Rash    Has patient had a PCN reaction causing immediate rash, facial/tongue/throat swelling, SOB or lightheadedness with hypotension: Yes Has patient had a PCN reaction causing severe rash involving mucus membranes or skin necrosis: Yes Has patient had a PCN reaction that required hospitalization No Has patient had a PCN reaction occurring within the last 10 years: No If all of the above answers are "NO", then may proceed with Cephalosporin use.     No orders of the defined types were placed in this encounter.   AUTHORIZATION INFORMATION Primary Insurance: 1D#: Group #:  Secondary Insurance: 1D#: Group #:  SCHEDULE INFORMATION: Date: 09/25/21 Time: Location: [place]

## 2021-09-18 ENCOUNTER — Encounter: Payer: Self-pay | Admitting: Gastroenterology

## 2021-09-24 NOTE — Anesthesia Preprocedure Evaluation (Signed)
Anesthesia Evaluation  Patient identified by MRN, date of birth, ID band Patient awake    Reviewed: Allergy & Precautions, NPO status , Patient's Chart, lab work & pertinent test results  Airway Mallampati: II  TM Distance: >3 FB Neck ROM: full    Dental  (+) Caps   Pulmonary neg pulmonary ROS,    Pulmonary exam normal        Cardiovascular negative cardio ROS Normal cardiovascular exam     Neuro/Psych PSYCHIATRIC DISORDERS Anxiety Depression negative neurological ROS     GI/Hepatic Neg liver ROS, GERD  Controlled,  Endo/Other  negative endocrine ROS  Renal/GU negative Renal ROS  negative genitourinary   Musculoskeletal   Abdominal (+) + obese,   Peds  Hematology negative hematology ROS (+)   Anesthesia Other Findings Past Medical History: No date: Arthritis     Comment:  lower back No date: Dental crowns present     Comment:  caps on top front teeth No date: Depression No date: Dyspnea No date: GERD (gastroesophageal reflux disease) No date: Hemorrhoids No date: Kidney stones  Past Surgical History: No date: ABDOMINAL HYSTERECTOMY No date: ARTHOSCOPIC ROTAOR CUFF REPAIR No date: BREAST SURGERY 09/26/2015: COLONOSCOPY WITH PROPOFOL; N/A     Comment:  Procedure: COLONOSCOPY WITH PROPOFOL;  Surgeon: Lucilla Lame, MD;  Location: [place];  Service:               Endoscopy;  Laterality: N/A; No date: LEG SURGERY 09/26/2015: POLYPECTOMY     Comment:  Procedure: POLYPECTOMY;  Surgeon: Lucilla Lame, MD;                Location: [place];  Service: Endoscopy;;  BMI    Body Mass Index: 30.86 kg/m      Reproductive/Obstetrics negative OB ROS                            Anesthesia Physical Anesthesia Plan  ASA: 2  Anesthesia Plan: General   Post-op Pain Management:    Induction: Intravenous  PONV Risk Score and Plan: Propofol infusion and  TIVA  Airway Management Planned: Natural Airway  Additional Equipment:   Intra-op Plan:   Post-operative Plan:   Informed Consent: I have reviewed the patients History and Physical, chart, labs and discussed the procedure including the risks, benefits and alternatives for the proposed anesthesia with the patient or authorized representative who has indicated his/her understanding and acceptance.     Dental Advisory Given  Plan Discussed with: Anesthesiologist, CRNA and Surgeon  Anesthesia Plan Comments:        Anesthesia Quick Evaluation

## 2021-09-25 ENCOUNTER — Ambulatory Visit (AMBULATORY_SURGERY_CENTER): Payer: Medicare HMO | Admitting: Anesthesiology

## 2021-09-25 ENCOUNTER — Encounter: Payer: Self-pay | Admitting: Gastroenterology

## 2021-09-25 ENCOUNTER — Ambulatory Visit
Admission: RE | Admit: 2021-09-25 | Discharge: 2021-09-25 | Disposition: A | Discharge status: Home / Self Care | Payer: Medicare HMO | Source: Ambulatory Visit | Attending: Gastroenterology | Admitting: Gastroenterology

## 2021-09-25 ENCOUNTER — Other Ambulatory Visit: Payer: Self-pay

## 2021-09-25 ENCOUNTER — Encounter
Admission: RE | Disposition: A | Discharge status: Home / Self Care | Payer: Self-pay | Source: Ambulatory Visit | Attending: Gastroenterology

## 2021-09-25 ENCOUNTER — Ambulatory Visit: Payer: Medicare HMO | Admitting: Anesthesiology

## 2021-09-25 DIAGNOSIS — Z87442 Personal history of urinary calculi: Secondary | ICD-10-CM | POA: Insufficient documentation

## 2021-09-25 DIAGNOSIS — D125 Benign neoplasm of sigmoid colon: Secondary | ICD-10-CM | POA: Insufficient documentation

## 2021-09-25 DIAGNOSIS — Z1211 Encounter for screening for malignant neoplasm of colon: Secondary | ICD-10-CM | POA: Insufficient documentation

## 2021-09-25 DIAGNOSIS — K219 Gastro-esophageal reflux disease without esophagitis: Secondary | ICD-10-CM | POA: Insufficient documentation

## 2021-09-25 DIAGNOSIS — Z860100 Personal history of colon polyps, unspecified: Secondary | ICD-10-CM

## 2021-09-25 DIAGNOSIS — F32A Depression, unspecified: Secondary | ICD-10-CM | POA: Diagnosis not present

## 2021-09-25 DIAGNOSIS — K5730 Diverticulosis of large intestine without perforation or abscess without bleeding: Secondary | ICD-10-CM | POA: Diagnosis not present

## 2021-09-25 DIAGNOSIS — K640 First degree hemorrhoids: Secondary | ICD-10-CM | POA: Insufficient documentation

## 2021-09-25 DIAGNOSIS — M47896 Other spondylosis, lumbar region: Secondary | ICD-10-CM | POA: Diagnosis not present

## 2021-09-25 DIAGNOSIS — K635 Polyp of colon: Secondary | ICD-10-CM

## 2021-09-25 DIAGNOSIS — D123 Benign neoplasm of transverse colon: Secondary | ICD-10-CM | POA: Diagnosis not present

## 2021-09-25 DIAGNOSIS — F419 Anxiety disorder, unspecified: Secondary | ICD-10-CM | POA: Insufficient documentation

## 2021-09-25 HISTORY — PX: POLYPECTOMY: SHX5525

## 2021-09-25 HISTORY — DX: Dyspnea, unspecified: R06.00

## 2021-09-25 HISTORY — PX: COLONOSCOPY WITH PROPOFOL: SHX5780

## 2021-09-25 SURGERY — COLONOSCOPY WITH PROPOFOL
Anesthesia: General | Site: Rectum

## 2021-09-25 MED ORDER — SODIUM CHLORIDE 0.9 % IV SOLN: INTRAVENOUS | Status: DC

## 2021-09-25 MED ORDER — LIDOCAINE HCL (CARDIAC) PF 100 MG/5ML IV SOSY
PREFILLED_SYRINGE | INTRAVENOUS | Status: DC | PRN
  Administered 2021-09-25: 50 mg via INTRAVENOUS

## 2021-09-25 MED ORDER — STERILE WATER FOR IRRIGATION IR SOLN
Status: DC | PRN
  Administered 2021-09-25: 100 mL

## 2021-09-25 MED ORDER — PROPOFOL 10 MG/ML IV BOLUS
INTRAVENOUS | Status: DC | PRN
  Administered 2021-09-25: 120 ug/kg/min via INTRAVENOUS

## 2021-09-25 MED ORDER — LACTATED RINGERS IV SOLN: INTRAVENOUS | Status: DC

## 2021-09-25 SURGICAL SUPPLY — 9 items
FORCEPS BIOP RAD 4 LRG CAP 4 (CUTTING FORCEPS) ×1 IMPLANT
GOWN CVR UNV OPN BCK APRN NK (MISCELLANEOUS) ×2 IMPLANT
GOWN ISOL THUMB LOOP REG UNIV (MISCELLANEOUS) ×4
KIT PRC NS LF DISP ENDO (KITS) ×1 IMPLANT
KIT PROCEDURE OLYMPUS (KITS) ×2
MANIFOLD NEPTUNE II (INSTRUMENTS) ×2 IMPLANT
SNARE COLD EXACTO (MISCELLANEOUS) ×1 IMPLANT
TRAP ETRAP POLY (MISCELLANEOUS) ×1 IMPLANT
WATER STERILE IRR 250ML POUR (IV SOLUTION) ×2 IMPLANT

## 2021-09-25 NOTE — Transfer of Care (Signed)
Immediate Anesthesia Transfer of Care Note  Patient: Sharon [name]  Procedure(s) Performed: COLONOSCOPY WITH BIOPSIES (Rectum) POLYPECTOMY (Rectum)  Patient Location: PACU  Anesthesia Type: General  Level of Consciousness: awake, alert  and patient cooperative  Airway and Oxygen Therapy: Patient Spontanous Breathing and Patient connected to supplemental oxygen  Post-op Assessment: Post-op Vital signs reviewed, Patient's Cardiovascular Status Stable, Respiratory Function Stable, Patent Airway and No signs of Nausea or vomiting  Post-op Vital Signs: Reviewed and stable  Complications: No notable events documented.

## 2021-09-25 NOTE — Op Note (Signed)
[facility] Gastroenterology Patient Name: Resa Rinks Procedure Date: 09/25/2021 8:58 AM MRN: [id number] Account #: [id number] Date of Birth: [birth year] Admit Type: Outpatient Age: 76 Room: [facility] OR ROOM 01 Gender: Female Note Status: Finalized Instrument Name: [id number] Procedure:             Colonoscopy Indications:           High risk colon cancer surveillance: Personal history                         of colonic polyps Providers:             Lucilla Lame MD, MD Medicines:             Propofol per Anesthesia Complications:         No immediate complications. Procedure:             Pre-Anesthesia Assessment:                        - Prior to the procedure, a History and Physical was                         performed, and patient medications and allergies were                         reviewed. The patient's tolerance of previous                         anesthesia was also reviewed. The risks and benefits                         of the procedure and the sedation options and risks                         were discussed with the patient. All questions were                         answered, and informed consent was obtained. Prior                         Anticoagulants: The patient has taken no previous                         anticoagulant or antiplatelet agents. ASA Grade                         Assessment: II - A patient with mild systemic disease.                         After reviewing the risks and benefits, the patient                         was deemed in satisfactory condition to undergo the                         procedure.                        After obtaining informed consent, the colonoscope was  passed under direct vision. Throughout the procedure,                         the patient's blood pressure, pulse, and oxygen                         saturations were monitored continuously. The                         Colonoscope was  introduced through the anus and                         advanced to the the cecum, identified by appendiceal                         orifice and ileocecal valve. The colonoscopy was                         performed without difficulty. The patient tolerated                         the procedure well. The quality of the bowel                         preparation was good. Findings:      The perianal and digital rectal examinations were normal.      Two sessile polyps were found in the transverse colon. The polyps were 3       to 5 mm in size. These polyps were removed with a cold snare. Resection       and retrieval were complete.      A 2 mm polyp was found in the transverse colon. The polyp was sessile.       The polyp was removed with a cold biopsy forceps. Resection and       retrieval were complete.      Two sessile polyps were found in the sigmoid colon. The polyps were 3 to       4 mm in size. These polyps were removed with a cold snare. Resection and       retrieval were complete.      Multiple small-mouthed diverticula were found in the sigmoid colon.      Non-bleeding internal hemorrhoids were found during retroflexion. The       hemorrhoids were Grade I (internal hemorrhoids that do not prolapse). Impression:            - Two 3 to 5 mm polyps in the transverse colon,                         removed with a cold snare. Resected and retrieved.                        - One 2 mm polyp in the transverse colon, removed with                         a cold biopsy forceps. Resected and retrieved.                        - Two 3 to 4 mm polyps in  the sigmoid colon, removed                         with a cold snare. Resected and retrieved.                        - Diverticulosis in the sigmoid colon.                        - Non-bleeding internal hemorrhoids. Recommendation:        - Discharge patient to home.                        - Resume previous diet.                        - Continue  present medications.                        - Await pathology results.                        - Repeat colonoscopy is not recommended for                         surveillance. Procedure Code(s):     --- Professional ---                        [phone], Colonoscopy, flexible; with removal of                         tumor(s), polyp(s), or other lesion(s) by snare                         technique                        45380, 93, Colonoscopy, flexible; with biopsy, single                         or multiple Diagnosis Code(s):     --- Professional ---                        Z86.010, Personal history of colonic polyps                        K63.5, Polyp of colon CPT copyright 2019 American Medical Association. All rights reserved. The codes documented in this report are preliminary and upon coder review may  be revised to meet current compliance requirements. Lucilla Lame MD, MD 09/25/2021 9:37:33 AM This report has been signed electronically. Number of Addenda: 0 Note Initiated On: 09/25/2021 8:58 AM Scope Withdrawal Time: 0 hours 10 minutes 10 seconds  Total Procedure Duration: 0 hours 24 minutes 31 seconds  Estimated Blood Loss:  Estimated blood loss: none.      [facility]

## 2021-09-25 NOTE — H&P (Signed)
Lucilla Lame, MD [address] [place], [place] [address] [phone] Fax : [phone]  Primary Care Physician:  Luiz Iron Primary Gastroenterologist:  Dr. Allen Norris  Pre-Procedure History & Physical: HPI:  Sharon [name] is a 76 y.o. female is here for an colonoscopy.   Past Medical History:  Diagnosis Date   Arthritis    lower back   Dental crowns present    caps on top front teeth   Depression    Dyspnea    GERD (gastroesophageal reflux disease)    Hemorrhoids    Kidney stones     Past Surgical History:  Procedure Laterality Date   ABDOMINAL HYSTERECTOMY     ARTHOSCOPIC ROTAOR CUFF REPAIR     BREAST SURGERY     COLONOSCOPY WITH PROPOFOL N/A 09/26/2015   Procedure: COLONOSCOPY WITH PROPOFOL;  Surgeon: Lucilla Lame, MD;  Location: [place];  Service: Endoscopy;  Laterality: N/A;   LEG SURGERY     POLYPECTOMY  09/26/2015   Procedure: POLYPECTOMY;  Surgeon: Lucilla Lame, MD;  Location: [place];  Service: Endoscopy;;    Prior to Admission medications   Medication Sig Start Date End Date Taking? Authorizing Provider  atorvastatin (LIPITOR) 10 MG tablet Take 10 mg by mouth daily.   Yes [provider]  DULoxetine (CYMBALTA) 60 MG capsule Take 60 mg by mouth daily. 03/15/15  Yes [provider]  Multiple Vitamins-Minerals (HAIR/SKIN/NAILS PO) Take 1 tablet by mouth daily.   Yes [provider]  ranitidine (ZANTAC) 75 MG tablet Take 75 mg by mouth daily as needed for heartburn. Reported on 09/13/2015   Yes [provider]  albuterol (VENTOLIN HFA) 108 (90 Base) MCG/ACT inhaler Inhale 1-2 puffs into the lungs every 6 (six) hours as needed for wheezing or shortness of breath. 07/16/21   Volney American, PA-C  fluconazole (DIFLUCAN) 150 MG tablet Take 1 tablet (150 mg total) by mouth every other day. Patient not taking: Reported on 09/18/2021 07/24/21   Volney American, PA-C   levofloxacin (LEVAQUIN) 750 MG tablet Take 750 mg by mouth daily. Patient not taking: Reported on 09/18/2021    [provider]  levofloxacin (LEVAQUIN) 750 MG tablet Take 1 tablet (750 mg total) by mouth daily. Patient not taking: Reported on 09/18/2021 07/24/21   Volney American, PA-C  methylPREDNISolone (MEDROL DOSEPAK) 4 MG TBPK tablet Take by mouth. Patient not taking: Reported on 09/18/2021    [provider]  oxyCODONE-acetaminophen (PERCOCET/ROXICET) 5-325 MG tablet Take 1 tablet by mouth every 6 (six) hours as needed for moderate pain (*for kidney stones*). Reported on 09/13/2015 Patient not taking: Reported on 09/18/2021 03/19/15   [provider]  predniSONE (DELTASONE) 20 MG tablet Take 2 tablets (40 mg total) by mouth daily with breakfast. Patient not taking: Reported on 09/18/2021 07/16/21   Volney American, PA-C  promethazine-dextromethorphan (PROMETHAZINE-DM) 6.25-15 MG/5ML syrup Take 5 mLs by mouth 4 (four) times daily as needed. Patient not taking: Reported on 09/18/2021 07/16/21   Volney American, PA-C  buPROPion [facility] XL) 150 MG 24 hr tablet  09/12/15 02/21/19  [provider]    Allergies as of 08/25/2021 - Review Complete 08/25/2021  Allergen Reaction Noted   Penicillins Rash 03/30/2015    Family History  Problem Relation Age of Onset   Healthy Mother    Healthy Father     Social History   Socioeconomic History   Marital status: Married    Spouse  name: Not on file   Number of children: Not on file   Years of education: Not on file   Highest education level: Not on file  Occupational History   Not on file  Tobacco Use   Smoking status: Never   Smokeless tobacco: Never  Vaping Use   Vaping Use: Never used  Substance and Sexual Activity   Alcohol use: No   Drug use: No   Sexual activity: Not Currently    Birth control/protection: None  Other Topics Concern   Not on file  Social History Narrative    Not on file   Social Determinants of Health   Financial Resource Strain: Not on file  Food Insecurity: Not on file  Transportation Needs: Not on file  Physical Activity: Not on file  Stress: Not on file  Social Connections: Not on file  Intimate Partner Violence: Not on file    Review of Systems: See HPI, otherwise negative ROS  Physical Exam: BP 137/88   Pulse 96   Temp (!) 97.2 F (36.2 C) (Temporal)   Ht 5' 7.5" (1.715 m)   Wt 89.4 kg   SpO2 99%   BMI 30.43 kg/m  General:   Alert,  pleasant and cooperative in NAD Head:  Normocephalic and atraumatic. Neck:  Supple; no masses or thyromegaly. Lungs:  Clear throughout to auscultation.    Heart:  Regular rate and rhythm. Abdomen:  Soft, nontender and nondistended. Normal bowel sounds, without guarding, and without rebound.   Neurologic:  Alert and  oriented x4;  grossly normal neurologically.  Impression/Plan: Sharon [name] is here for an colonoscopy to be performed for a history of adenomatous polyps on 2017   Risks, benefits, limitations, and alternatives regarding  colonoscopy have been reviewed with the patient.  Questions have been answered.  All parties agreeable.   Lucilla Lame, MD  09/25/2021, 9:00 AM

## 2021-09-25 NOTE — Anesthesia Postprocedure Evaluation (Signed)
Anesthesia Post Note  Patient: Sharon [name]  Procedure(s) Performed: COLONOSCOPY WITH BIOPSIES (Rectum) POLYPECTOMY (Rectum)     Patient location during evaluation: PACU Anesthesia Type: General Level of consciousness: awake and alert Pain management: pain level controlled Vital Signs Assessment: post-procedure vital signs reviewed and stable Respiratory status: spontaneous breathing, nonlabored ventilation and respiratory function stable Cardiovascular status: blood pressure returned to baseline and stable Postop Assessment: no apparent nausea or vomiting Anesthetic complications: no   No notable events documented.  [place] Ouch

## 2021-09-26 LAB — SURGICAL PATHOLOGY

## 2021-09-27 ENCOUNTER — Encounter: Payer: Self-pay | Admitting: Gastroenterology

## 2022-08-17 ENCOUNTER — Ambulatory Visit
Admission: EM | Admit: 2022-08-17 | Discharge: 2022-08-17 | Disposition: A | Discharge status: Home / Self Care | Payer: Medicare HMO

## 2022-08-17 ENCOUNTER — Other Ambulatory Visit: Payer: Self-pay

## 2022-08-17 ENCOUNTER — Encounter: Payer: Self-pay | Admitting: Emergency Medicine

## 2022-08-17 DIAGNOSIS — T8130XA Disruption of wound, unspecified, initial encounter: Secondary | ICD-10-CM | POA: Diagnosis not present

## 2022-08-17 NOTE — ED Notes (Signed)
Provider assessed pt and discussed with pt the need to follow-up with dermatologist to provide service pt requesting. Provider reported to refund pt co-pay. Pt reported to pt access and was agitated and left prior to pt access being able to explain that co-pay would have to be returned through billing.

## 2022-08-17 NOTE — ED Provider Notes (Signed)
[facility]    CSN: [id number] Arrival date & time: 08/17/22  9629      History   Chief Complaint Chief Complaint  Patient presents with  . Skin Problem    HPI Sharon [name] is a 77 y.o. female.   The history is provided by the patient.   The patient presents after her dermatology procedure on 6/17 in which sutures were placed.  She states the sutures came out last night.  Patient states when she woke up this morning, "there was blood everywhere".  Patient has the area covered at this time.  Patient states "I need the sutures were placed."  Patient states that she contacted the dermatologist office today, but their office was closed.  Patient has a bandage to the back of the left shoulder.  Past Medical History:  Diagnosis Date  . Arthritis    lower back  . Dental crowns present    caps on top front teeth  . Depression   . Dyspnea   . GERD (gastroesophageal reflux disease)   . Hemorrhoids   . Kidney stones     Patient Active Problem List   Diagnosis Date Noted  . History of colonic polyps   . Special screening for malignant neoplasms, colon   . Benign neoplasm of ascending colon   . Benign neoplasm of sigmoid colon   . Mixed incontinence 03/16/2014  . Calculus of kidney 03/16/2014  . Renal colic 03/16/2014  . Pain in shoulder 11/23/2013  . Colon polyp 11/10/2013  . Cough 11/10/2013  . Clinical depression 11/10/2013  . 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 ABDOMINAL HYSTERECTOMY    . ARTHOSCOPIC ROTAOR CUFF REPAIR    . BREAST SURGERY    . COLONOSCOPY WITH PROPOFOL N/A 09/26/2015   Procedure: COLONOSCOPY WITH PROPOFOL;  Surgeon: Midge Minium, MD;  Location: [facility];  Service: Endoscopy;  Laterality: N/A;  . COLONOSCOPY WITH PROPOFOL N/A 09/25/2021   Procedure: COLONOSCOPY WITH BIOPSIES;  Surgeon: Midge Minium, MD;  Location: [facility];  Service: Endoscopy;  Laterality: N/A;  .  LEG SURGERY    . POLYPECTOMY  09/26/2015   Procedure: POLYPECTOMY;  Surgeon: Midge Minium, MD;  Location: [facility];  Service: Endoscopy;;  . POLYPECTOMY N/A 09/25/2021   Procedure: POLYPECTOMY;  Surgeon: Midge Minium, MD;  Location: [facility];  Service: Endoscopy;  Laterality: N/A;    OB History     Gravida  1   Para  1   Term  1   Preterm      AB      Living         SAB      IAB      Ectopic      Multiple      Live Births               Home Medications    Prior to Admission medications   Medication Sig Start Date End Date Taking? Authorizing Provider  albuterol (VENTOLIN HFA) 108 (90 Base) MCG/ACT inhaler Inhale 1-2 puffs into the lungs every 6 (six) hours as needed for wheezing or shortness of breath. 07/16/21   Particia Nearing, PA-C  atorvastatin (LIPITOR) 10 MG tablet Take 10 mg by mouth daily.    [provider]  DULoxetine (CYMBALTA) 60 MG capsule Take 60 mg by mouth daily. 03/15/15   [provider]  fluconazole (DIFLUCAN) 150 MG tablet Take  1 tablet (150 mg total) by mouth every other day. Patient not taking: Reported on 09/18/2021 07/24/21   Particia Nearing, PA-C  levofloxacin (LEVAQUIN) 750 MG tablet Take 750 mg by mouth daily. Patient not taking: Reported on 09/18/2021    [provider]  levofloxacin (LEVAQUIN) 750 MG tablet Take 1 tablet (750 mg total) by mouth daily. Patient not taking: Reported on 09/18/2021 07/24/21   Particia Nearing, PA-C  methylPREDNISolone (MEDROL DOSEPAK) 4 MG TBPK tablet Take by mouth. Patient not taking: Reported on 09/18/2021    [provider]  Multiple Vitamins-Minerals (HAIR/SKIN/NAILS PO) Take 1 tablet by mouth daily.    [provider]  oxyCODONE-acetaminophen (PERCOCET/ROXICET) 5-325 MG tablet Take 1 tablet by mouth every 6 (six) hours as needed for moderate pain (*for kidney stones*). Reported on 09/13/2015 Patient not taking: Reported on  09/18/2021 03/19/15   [provider]  predniSONE (DELTASONE) 20 MG tablet Take 2 tablets (40 mg total) by mouth daily with breakfast. Patient not taking: Reported on 09/18/2021 07/16/21   Particia Nearing, PA-C  promethazine-dextromethorphan (PROMETHAZINE-DM) 6.25-15 MG/5ML syrup Take 5 mLs by mouth 4 (four) times daily as needed. Patient not taking: Reported on 09/18/2021 07/16/21   Particia Nearing, PA-C  ranitidine (ZANTAC) 75 MG tablet Take 75 mg by mouth daily as needed for heartburn. Reported on 09/13/2015    [provider]  buPROPion (WELLBUTRIN XL) 150 MG 24 hr tablet  09/12/15 02/21/19  [provider]    Family History Family History  Problem Relation Age of Onset  . Healthy Mother   . Healthy Father     Social History Social History   Tobacco Use  . Smoking status: Never  . Smokeless tobacco: Never  Vaping Use  . Vaping Use: Never used  Substance Use Topics  . Alcohol use: No  . Drug use: No     Allergies   Penicillins   Review of Systems Review of Systems Per HPI  Physical Exam Triage Vital Signs ED Triage Vitals  Enc Vitals Group     BP 08/17/22 0926 135/88     Pulse Rate 08/17/22 0926 96     Resp 08/17/22 0926 20     Temp 08/17/22 0926 97.9 F (36.6 C)     Temp Source 08/17/22 0926 Oral     SpO2 08/17/22 0926 97 %     Weight --      Height --      Head Circumference --      Peak Flow --      Pain Score 08/17/22 0924 0     Pain Loc --      Pain Edu? --      Excl. in GC? --    No data found.  Updated Vital Signs BP 135/88 (BP Location: Right Arm)   Pulse 96   Temp 97.9 F (36.6 C) (Oral)   Resp 20   SpO2 97%   Visual Acuity Right Eye Distance:   Left Eye Distance:   Bilateral Distance:    Right Eye Near:   Left Eye Near:    Bilateral Near:     Physical Exam Vitals and nursing note reviewed.  Constitutional:      Appearance: Normal appearance.  HENT:     Head: Normocephalic.  Eyes:      Extraocular Movements: Extraocular movements intact.     Pupils: Pupils are equal, round, and reactive to light.  Pulmonary:     Effort: Pulmonary  effort is normal.  Musculoskeletal:     Cervical back: Normal range of motion.  Skin:    General: Skin is warm and dry.     Findings: Wound present.     Comments: Bandage noted to the posterior region of the left shoulder.  Neurological:     General: No focal deficit present.     Mental Status: She is alert and oriented to person, place, and time.  Psychiatric:        Mood and Affect: Mood normal.     UC Treatments / Results  Labs (all labs ordered are listed, but only abnormal results are displayed) Labs Reviewed - No data to display  EKG   Radiology No results found.  Procedures Procedures (including critical care time)  Medications Ordered in UC Medications - No data to display  Initial Impression / Assessment and Plan / UC Course  I have reviewed the triage vital signs and the nursing notes.  Pertinent labs & imaging results that were available during my care of the patient were reviewed by me and considered in my medical decision making (see chart for details).  The patient is well-appearing, she is in no acute distress, vital signs are stable.  Patient presents after a dermatological procedure on 6/17.  Patient states that sutures were placed to the site after her procedure, but they apparently "came out" during the night.  Patient states when she woke up this morning, blood was "everywhere".  Patient presents today requesting that the sutures to be replaced.  Patient was advised that because this provider is not a dermatologist, nor is this provider familiar with the type of procedure that she had and is not averse of whether or not this suture needs to be replaced, this provider does not feel comfortable replacing the suture.  Patient was asked whether or not the dermatology office had an on-call physician, patient stated  "no".  Patient asked "so you are not going to replace the suture?"  Patient was again advised by this provider that based on this providers lack of knowledge regarding the procedure and follow-up and possible complications regarding the types of procedure that she may have had, that this provider did not feel comfortable replacing the sutures.  At that time, the patient asked that she have her money back.  Patient grabbed her belongings, stop by the front desk and the left.  Patient was provided this provider's professional opinion regarding her chief complaint.   Final Clinical Impressions(s) / UC Diagnoses   Final diagnoses:  None   Discharge Instructions   None    ED Prescriptions   None    PDMP not reviewed this encounter.   Abran Cantor, NP 08/17/22 1321

## 2022-08-17 NOTE — Discharge Instructions (Addendum)
AMA

## 2022-08-17 NOTE — ED Triage Notes (Addendum)
Pt reports had a dermatology procedure to remove a "skin problem" on posterior left arm on Monday. Pt reports "sutures" came out last night and "I need them replaced, they were supposed to be in 2 weeks". contacted dermatologist and the office is closed today. Pt reports woke up this am and states "there was blood ever way." Site covered in triage. No bleeding noted to bandage.

## 2023-03-01 ENCOUNTER — Ambulatory Visit (HOSPITAL_COMMUNITY): Payer: No Typology Code available for payment source | Attending: Orthopedic Surgery | Admitting: Occupational Therapy

## 2023-03-01 ENCOUNTER — Encounter (HOSPITAL_COMMUNITY): Payer: Self-pay | Admitting: Occupational Therapy

## 2023-03-01 ENCOUNTER — Other Ambulatory Visit: Payer: Self-pay

## 2023-03-01 DIAGNOSIS — M25611 Stiffness of right shoulder, not elsewhere classified: Secondary | ICD-10-CM | POA: Insufficient documentation

## 2023-03-01 DIAGNOSIS — R29898 Other symptoms and signs involving the musculoskeletal system: Secondary | ICD-10-CM | POA: Diagnosis present

## 2023-03-01 DIAGNOSIS — M25511 Pain in right shoulder: Secondary | ICD-10-CM | POA: Diagnosis present

## 2023-03-01 NOTE — Patient Instructions (Signed)
1) Seated Row   Sit up straight with elbows by your sides. Pull back with shoulders/elbows, keeping forearms straight, as if pulling back on the reins of a horse. Squeeze shoulder blades together. Repeat _10-15__times, __2-3__sets/day    2) Shoulder Elevation    Sit up straight with arms by your sides. Slowly bring your shoulders up towards your ears. Repeat_10-15__times, __2-3__ sets/day    3) Shoulder Extension    Sit up straight with both arms by your side, draw your arms back behind your waist. Keep your elbows straight. Repeat __10-15__times, __2-3__sets/day.    4) SHOULDER: Flexion On Table   Place hands on towel placed on table, elbows straight. Lean forward with you upper body, pushing towel away from body.  10 reps per set, 3 sets per day  5) Abduction (Passive)   With arm out to side, resting on towel placed on table with palm DOWN, keeping trunk away from table, lean to the side while pushing towel away from body.  Repeat 10 times. Do 3 sessions per day.  Copyright  VHI. All rights reserved.     6) Internal Rotation (Assistive)   Seated with elbow bent at right angle and held against side, slide arm on table surface in an inward arc keeping elbow anchored in place. Repeat 10 times. Do 3 sessions per day. Activity: Use this motion to brush crumbs off the table.  Copyright  VHI. All rights reserved.

## 2023-03-01 NOTE — Therapy (Signed)
 OUTPATIENT OCCUPATIONAL THERAPY ORTHO EVALUATION  Patient Name: Sharon [name] MRN: [id number] DOB:February 23, 1946, 78 y.o., female Today's Date: 03/01/2023    END OF SESSION:  OT End of Session - 03/01/23 1149     Visit Number 1    Number of Visits 12    Date for OT Re-Evaluation 04/12/23    Authorization Type [employer], $25 copay    Progress Note Due on Visit 10    OT Start Time 1100    OT Stop Time 1140    OT Time Calculation (min) 40 min    Activity Tolerance Patient tolerated treatment well    Behavior During Therapy WFL for tasks assessed/performed             Past Medical History:  Diagnosis Date   Arthritis    lower back   Dental crowns present    caps on top front teeth   Depression    Dyspnea    GERD (gastroesophageal reflux disease)    Hemorrhoids    Kidney stones    Past Surgical History:  Procedure Laterality Date   ABDOMINAL HYSTERECTOMY     ARTHOSCOPIC ROTAOR CUFF REPAIR     BREAST SURGERY     COLONOSCOPY WITH PROPOFOL  N/A 09/26/2015   Procedure: COLONOSCOPY WITH PROPOFOL ;  Surgeon: Rogelia Copping, MD;  Location: [facility];  Service: Endoscopy;  Laterality: N/A;   COLONOSCOPY WITH PROPOFOL  N/A 09/25/2021   Procedure: COLONOSCOPY WITH BIOPSIES;  Surgeon: Copping Rogelia, MD;  Location: [facility];  Service: Endoscopy;  Laterality: N/A;   LEG SURGERY     POLYPECTOMY  09/26/2015   Procedure: POLYPECTOMY;  Surgeon: Rogelia Copping, MD;  Location: [facility];  Service: Endoscopy;;   POLYPECTOMY N/A 09/25/2021   Procedure: POLYPECTOMY;  Surgeon: Copping Rogelia, MD;  Location: [facility];  Service: Endoscopy;  Laterality: N/A;   Patient Active Problem List   Diagnosis Date Noted   History of colonic polyps    Special screening for malignant neoplasms, colon    Benign neoplasm of ascending colon    Benign neoplasm of sigmoid colon    Mixed incontinence 03/16/2014   Calculus of kidney 03/16/2014   Renal colic 03/16/2014   Pain  in shoulder 11/23/2013   Colon polyp 11/10/2013   Cough 11/10/2013   Clinical depression 11/10/2013   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 Alease Oyster  ONSET DATE:   REFERRING DIAG: s/p right TSA  THERAPY DIAG:  Acute pain of right shoulder  Stiffness of right shoulder, not elsewhere classified  Other symptoms and signs involving the musculoskeletal system  Rationale for Evaluation and Treatment: Rehabilitation  SUBJECTIVE:   SUBJECTIVE STATEMENT: S: I haven't had much pain. Pt accompanied by: self  PERTINENT HISTORY: Pt is a 78 y/o female s/p right TSA on 02/06/23. Pt is not currently wearing sling. Did not have any HH therapy due to company scheduling. Pt with hx of RCR and bicep tendon repair 10+ years ago.   PRECAUTIONS: Shoulder   WEIGHT BEARING RESTRICTIONS: Yes NWB  PAIN:  Are you having pain? No  FALLS: Has patient fallen in last 6 months? No  PLOF: Independent  PATIENT GOALS: To improve reaching ability.   NEXT MD VISIT: approximately 6 weeks  OBJECTIVE:  Note: Objective measures were completed at Evaluation unless otherwise noted.  HAND DOMINANCE: Right  ADLs: Pt reports difficulty with reaching behind back and into cabinets. Pt has difficulty lifting items.  FUNCTIONAL OUTCOME MEASURES: FOTO: 58/100  UPPER EXTREMITY ROM:       Assessed seated, er/IR adducted  Active ROM Right eval  Shoulder flexion 112  Shoulder abduction 92  Shoulder internal rotation 90  Shoulder external rotation 27  (Blank rows = not tested)  Assessed supine, er/IR adducted  Passive ROM Right eval  Shoulder flexion 140  Shoulder abduction 130  Shoulder internal rotation 90  Shoulder external rotation 25  (Blank rows = not tested)  UPPER EXTREMITY MMT:       Assessed on observation, formal MMT not completed due to protocol/precautions  MMT Right eval  Shoulder flexion 3/5  Shoulder abduction 3/5   Shoulder internal rotation 3/5  Shoulder external rotation 3-/5  (Blank rows = not tested)  SENSATION: Occasional numbness around incision  EDEMA: None  COGNITION: Overall cognitive status: Within functional limits for tasks assessed  OBSERVATIONS: Mod to max fascial restrictions along upper arm and anterior shoulder, mod restrictions in trapezius and scapular regions   TREATMENT DATE:  03/01/23 -P/ROM: supine-flexion, abduction, er/IR, horizontal abduction, 5 reps -Scapular A/ROM: row, extension, elevation/depression, 5 reps -Table slides: flexion, abduction, er, 5 reps                                                                                                                             PATIENT EDUCATION: Education details: scapular A/ROM, table slides Person educated: Patient Education method: Explanation, Demonstration, and Handouts Education comprehension: verbalized understanding and returned demonstration  HOME EXERCISE PROGRAM: Eval: scapular A/ROM, table slides  GOALS: Goals reviewed with patient? Yes  SHORT TERM GOALS: Target date: 03/22/23  Pt will be provided with and educated on HEP to improve mobility in RUE required for use during ADL completion.   Goal status: INITIAL  2.  Pt will increase RUE P/ROM by 10+ degrees to improve ability to use RUE during dressing tasks with minimal compensatory techniques.   Goal status: INITIAL    LONG TERM GOALS: Target date: 04/12/23  Pt will decrease pain in RUE to 3/10 or less to improve ability to sleep for 2+ consecutive hours without waking due to pain.   Goal status: INITIAL  2.  Pt will decrease RUE fascial restrictions to min amounts or less to improve mobility required for functional reaching tasks.   Goal status: INITIAL  3.  Pt will increase RUE A/ROM by 35+ degrees to improve ability to use RUE when reaching overhead or behind back during dressing and bathing tasks.   Goal status: INITIAL  4.   Pt will increase RUE strength to 4/5 or greater to improve ability to use RUE when lifting or carrying items during meal preparation/housework/yardwork tasks.   Goal status: INITIAL  ASSESSMENT:  CLINICAL IMPRESSION: Patient is a 78 y.o. female who was seen today for occupational therapy evaluation s/p right TSA on 02/06/23. Pt reports she did not have HH services and has been reaching and completing a few exercises at home.  Pt with low pain level since surgery, is completing ADLs but modifying due to stiffness and weakness. Pt will benefit from skilled OT services to improve pain, ROM, strength, and functional use of RUE as dominant during ADLs.     PERFORMANCE DEFICITS: in functional skills including ADLs, IADLs, ROM, strength, pain, fascial restrictions, muscle spasms, and UE functional use  IMPAIRMENTS: are limiting patient from ADLs, IADLs, rest and sleep, work, and leisure.   COMORBIDITIES: has no other co-morbidities that affects occupational performance. Patient will benefit from skilled OT to address above impairments and improve overall function.  MODIFICATION OR ASSISTANCE TO COMPLETE EVALUATION: No modification of tasks or assist necessary to complete an evaluation.  OT OCCUPATIONAL PROFILE AND HISTORY: Problem focused assessment: Including review of records relating to presenting problem.  CLINICAL DECISION MAKING: LOW - limited treatment options, no task modification necessary  REHAB POTENTIAL: Good  EVALUATION COMPLEXITY: Low      PLAN:  OT FREQUENCY: 2x/week  OT DURATION: 6 weeks  PLANNED INTERVENTIONS: 97168 OT Re-evaluation, 97535 self care/ADL training, 02889 therapeutic exercise, 97530 therapeutic activity, 97140 manual therapy, 97035 ultrasound, 97014 electrical stimulation unattended, patient/family education, and DME and/or AE instructions  RECOMMENDED OTHER SERVICES: None at this time  CONSULTED AND AGREED WITH PLAN OF CARE: Patient  PLAN FOR NEXT  SESSION: Follow up on HEP, initiate manual techniques, P/ROM, AA/ROM progressing to A/ROM   Sonny Cory, OTR/L  [phone] 03/01/2023, 11:51 AM

## 2023-03-04 ENCOUNTER — Encounter (HOSPITAL_COMMUNITY): Payer: Self-pay | Admitting: Occupational Therapy

## 2023-03-04 ENCOUNTER — Ambulatory Visit (HOSPITAL_COMMUNITY): Payer: No Typology Code available for payment source | Admitting: Occupational Therapy

## 2023-03-04 DIAGNOSIS — M25511 Pain in right shoulder: Secondary | ICD-10-CM | POA: Diagnosis not present

## 2023-03-04 DIAGNOSIS — R29898 Other symptoms and signs involving the musculoskeletal system: Secondary | ICD-10-CM

## 2023-03-04 DIAGNOSIS — M25611 Stiffness of right shoulder, not elsewhere classified: Secondary | ICD-10-CM

## 2023-03-04 NOTE — Therapy (Signed)
 OUTPATIENT OCCUPATIONAL THERAPY ORTHO TREATMENT  Patient Name: Sharon [name] MRN: [id number] DOB:12/17/1945, 78 y.o., female Today's Date: 03/04/2023    END OF SESSION:  OT End of Session - 03/04/23 1103     Visit Number 2    Number of Visits 12    Date for OT Re-Evaluation 04/12/23    Authorization Type [employer], $25 copay    Progress Note Due on Visit 10    OT Start Time 1015    OT Stop Time 1055    OT Time Calculation (min) 40 min    Activity Tolerance Patient tolerated treatment well    Behavior During Therapy WFL for tasks assessed/performed              Past Medical History:  Diagnosis Date   Arthritis    lower back   Dental crowns present    caps on top front teeth   Depression    Dyspnea    GERD (gastroesophageal reflux disease)    Hemorrhoids    Kidney stones    Past Surgical History:  Procedure Laterality Date   ABDOMINAL HYSTERECTOMY     ARTHOSCOPIC ROTAOR CUFF REPAIR     BREAST SURGERY     COLONOSCOPY WITH PROPOFOL  N/A 09/26/2015   Procedure: COLONOSCOPY WITH PROPOFOL ;  Surgeon: Rogelia Copping, MD;  Location: [facility];  Service: Endoscopy;  Laterality: N/A;   COLONOSCOPY WITH PROPOFOL  N/A 09/25/2021   Procedure: COLONOSCOPY WITH BIOPSIES;  Surgeon: Copping Rogelia, MD;  Location: [facility];  Service: Endoscopy;  Laterality: N/A;   LEG SURGERY     POLYPECTOMY  09/26/2015   Procedure: POLYPECTOMY;  Surgeon: Rogelia Copping, MD;  Location: [facility];  Service: Endoscopy;;   POLYPECTOMY N/A 09/25/2021   Procedure: POLYPECTOMY;  Surgeon: Copping Rogelia, MD;  Location: [facility];  Service: Endoscopy;  Laterality: N/A;   Patient Active Problem List   Diagnosis Date Noted   History of colonic polyps    Special screening for malignant neoplasms, colon    Benign neoplasm of ascending colon    Benign neoplasm of sigmoid colon    Mixed incontinence 03/16/2014   Calculus of kidney 03/16/2014   Renal colic 03/16/2014    Pain in shoulder 11/23/2013   Colon polyp 11/10/2013   Cough 11/10/2013   Clinical depression 11/10/2013   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 Alease Oyster  ONSET DATE: 02/06/23  REFERRING DIAG: s/p right TSA  THERAPY DIAG:  Acute pain of right shoulder  Stiffness of right shoulder, not elsewhere classified  Other symptoms and signs involving the musculoskeletal system  Rationale for Evaluation and Treatment: Rehabilitation  SUBJECTIVE:   SUBJECTIVE STATEMENT: S: It's just a little sore today.  PERTINENT HISTORY: Pt is a 78 y/o female s/p right TSA on 02/06/23. Pt is not currently wearing sling. Did not have any HH therapy due to company scheduling. Pt with hx of RCR and bicep tendon repair 10+ years ago.   PRECAUTIONS: Shoulder   WEIGHT BEARING RESTRICTIONS: Yes NWB  PAIN:  Are you having pain? Yes: NPRS scale: 2/10 Pain location: anterior shoulder Pain description: sore Aggravating factors: weather Relieving factors: unsure  FALLS: Has patient fallen in last 6 months? No  PLOF: Independent  PATIENT GOALS: To improve reaching ability.   NEXT MD VISIT: approximately 6 weeks  OBJECTIVE:  Note: Objective measures were completed at Evaluation unless otherwise noted.  HAND DOMINANCE: Right  ADLs: Pt reports difficulty with  reaching behind back and into cabinets. Pt has difficulty lifting items.    FUNCTIONAL OUTCOME MEASURES: FOTO: 58/100  UPPER EXTREMITY ROM:       Assessed seated, er/IR adducted  Active ROM Right eval  Shoulder flexion 112  Shoulder abduction 92  Shoulder internal rotation 90  Shoulder external rotation 27  (Blank rows = not tested)  Assessed supine, er/IR adducted  Passive ROM Right eval  Shoulder flexion 140  Shoulder abduction 130  Shoulder internal rotation 90  Shoulder external rotation 25  (Blank rows = not tested)  UPPER EXTREMITY MMT:       Assessed on observation,  formal MMT not completed due to protocol/precautions  MMT Right eval  Shoulder flexion 3/5  Shoulder abduction 3/5  Shoulder internal rotation 3/5  Shoulder external rotation 3-/5  (Blank rows = not tested)  SENSATION: Occasional numbness around incision  OBSERVATIONS: Mod to max fascial restrictions along upper arm and anterior shoulder, mod restrictions in trapezius and scapular regions   TREATMENT DATE:  03/03/22 -Myofascial release and manual techniques to right upper arm, anterior shoulder, trapezius, and scapular regions to decrease pain and fascial restrictions and improve joint ROM -P/ROM: supine-flexion, abduction, er/IR, horizontal abduction, 5 reps -AA/ROM: supine-protraction, flexion, er/IR, abduction, horizontal abduction, 10 reps -Scapular A/ROM: row, extension, elevation/depression, 10 reps -Isometrics: standing-flexion, extension, er, IR, abduction, adduction, 3x10 holds -Pulleys: 1' flexion -Therapy ball stretches: flexion, abduction, 10 reps  03/01/23 -P/ROM: supine-flexion, abduction, er/IR, horizontal abduction, 5 reps -Scapular A/ROM: row, extension, elevation/depression, 5 reps -Table slides: flexion, abduction, er, 5 reps                                                                                                                             PATIENT EDUCATION: Education details: continue HEP Person educated: Patient Education method: Explanation, Demonstration, and Handouts Education comprehension: verbalized understanding and returned demonstration  HOME EXERCISE PROGRAM: Eval: scapular A/ROM, table slides  GOALS: Goals reviewed with patient? Yes  SHORT TERM GOALS: Target date: 03/22/23  Pt will be provided with and educated on HEP to improve mobility in RUE required for use during ADL completion.   Goal status: IN PROGRESS  2.  Pt will increase RUE P/ROM by 10+ degrees to improve ability to use RUE during dressing tasks with minimal  compensatory techniques.   Goal status: IN PROGRESS    LONG TERM GOALS: Target date: 04/12/23  Pt will decrease pain in RUE to 3/10 or less to improve ability to sleep for 2+ consecutive hours without waking due to pain.   Goal status: IN PROGRESS  2.  Pt will decrease RUE fascial restrictions to min amounts or less to improve mobility required for functional reaching tasks.   Goal status: IN PROGRESS  3.  Pt will increase RUE A/ROM by 35+ degrees to improve ability to use RUE when reaching overhead or behind back during dressing and bathing tasks.   Goal status: IN PROGRESS  4.  Pt will increase RUE strength to 4/5 or greater to improve ability to use RUE when lifting or carrying items during meal preparation/housework/yardwork tasks.   Goal status: IN PROGRESS  ASSESSMENT:  CLINICAL IMPRESSION: Pt reports mild soreness at beginning of session, HEP is going well. Initiated manual techniques, passive stretching, isometrics and AA/ROM in supine. Pt with ROM at approximately 65-70% during passive and AA/ROM. Pt with good joint mobility, mild tenderness around incision area during manual techniques. Verbal cuing for form and technique during tasks.   PERFORMANCE DEFICITS: in functional skills including ADLs, IADLs, ROM, strength, pain, fascial restrictions, muscle spasms, and UE functional use     PLAN:  OT FREQUENCY: 2x/week  OT DURATION: 6 weeks  PLANNED INTERVENTIONS: 97168 OT Re-evaluation, 97535 self care/ADL training, 02889 therapeutic exercise, 97530 therapeutic activity, 97140 manual therapy, 97035 ultrasound, 97014 electrical stimulation unattended, patient/family education, and DME and/or AE instructions  CONSULTED AND AGREED WITH PLAN OF CARE: Patient  PLAN FOR NEXT SESSION: Continue with AA/ROM progressing to standing, add thumb tacks and wall wash. Update HEP to AA/ROM   Sonny Cory, OTR/L  [phone] 03/04/2023, 11:03 AM

## 2023-03-07 ENCOUNTER — Encounter (HOSPITAL_COMMUNITY): Payer: Self-pay | Admitting: Occupational Therapy

## 2023-03-07 ENCOUNTER — Ambulatory Visit (HOSPITAL_COMMUNITY): Payer: No Typology Code available for payment source | Admitting: Occupational Therapy

## 2023-03-07 DIAGNOSIS — R29898 Other symptoms and signs involving the musculoskeletal system: Secondary | ICD-10-CM

## 2023-03-07 DIAGNOSIS — M25511 Pain in right shoulder: Secondary | ICD-10-CM

## 2023-03-07 DIAGNOSIS — M25611 Stiffness of right shoulder, not elsewhere classified: Secondary | ICD-10-CM

## 2023-03-07 NOTE — Therapy (Signed)
 OUTPATIENT OCCUPATIONAL THERAPY ORTHO TREATMENT  Patient Name: Sharon [name] MRN: [id number] DOB:January 28, 1946, 78 y.o., female Today's Date: 03/07/2023    END OF SESSION:  OT End of Session - 03/07/23 1101     Visit Number 3    Number of Visits 12    Date for OT Re-Evaluation 04/12/23    Authorization Type [employer], $25 copay    Progress Note Due on Visit 10    OT Start Time 1022    OT Stop Time 1101    OT Time Calculation (min) 39 min    Activity Tolerance Patient tolerated treatment well    Behavior During Therapy WFL for tasks assessed/performed               Past Medical History:  Diagnosis Date   Arthritis    lower back   Dental crowns present    caps on top front teeth   Depression    Dyspnea    GERD (gastroesophageal reflux disease)    Hemorrhoids    Kidney stones    Past Surgical History:  Procedure Laterality Date   ABDOMINAL HYSTERECTOMY     ARTHOSCOPIC ROTAOR CUFF REPAIR     BREAST SURGERY     COLONOSCOPY WITH PROPOFOL  N/A 09/26/2015   Procedure: COLONOSCOPY WITH PROPOFOL ;  Surgeon: Rogelia Copping, MD;  Location: [facility];  Service: Endoscopy;  Laterality: N/A;   COLONOSCOPY WITH PROPOFOL  N/A 09/25/2021   Procedure: COLONOSCOPY WITH BIOPSIES;  Surgeon: Copping Rogelia, MD;  Location: [facility];  Service: Endoscopy;  Laterality: N/A;   LEG SURGERY     POLYPECTOMY  09/26/2015   Procedure: POLYPECTOMY;  Surgeon: Rogelia Copping, MD;  Location: [facility];  Service: Endoscopy;;   POLYPECTOMY N/A 09/25/2021   Procedure: POLYPECTOMY;  Surgeon: Copping Rogelia, MD;  Location: [facility];  Service: Endoscopy;  Laterality: N/A;   Patient Active Problem List   Diagnosis Date Noted   History of colonic polyps    Special screening for malignant neoplasms, colon    Benign neoplasm of ascending colon    Benign neoplasm of sigmoid colon    Mixed incontinence 03/16/2014   Calculus of kidney 03/16/2014   Renal colic 03/16/2014    Pain in shoulder 11/23/2013   Colon polyp 11/10/2013   Cough 11/10/2013   Clinical depression 11/10/2013   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 Alease Oyster  ONSET DATE: 02/06/23  REFERRING DIAG: s/p right TSA  THERAPY DIAG:  Acute pain of right shoulder  Stiffness of right shoulder, not elsewhere classified  Other symptoms and signs involving the musculoskeletal system  Rationale for Evaluation and Treatment: Rehabilitation  SUBJECTIVE:   SUBJECTIVE STATEMENT: S: It's just a little sore today.  PERTINENT HISTORY: Pt is a 78 y/o female s/p right TSA on 02/06/23. Pt is not currently wearing sling. Did not have any HH therapy due to company scheduling. Pt with hx of RCR and bicep tendon repair 10+ years ago.   PRECAUTIONS: Shoulder   WEIGHT BEARING RESTRICTIONS: Yes NWB  PAIN:  Are you having pain? Yes: NPRS scale: 2/10 Pain location: anterior shoulder Pain description: sore Aggravating factors: weather Relieving factors: unsure  FALLS: Has patient fallen in last 6 months? No  PLOF: Independent  PATIENT GOALS: To improve reaching ability.   NEXT MD VISIT: approximately 6 weeks  OBJECTIVE:  Note: Objective measures were completed at Evaluation unless otherwise noted.  HAND DOMINANCE: Right  ADLs: Pt reports difficulty  with reaching behind back and into cabinets. Pt has difficulty lifting items.    FUNCTIONAL OUTCOME MEASURES: FOTO: 58/100  UPPER EXTREMITY ROM:       Assessed seated, er/IR adducted  Active ROM Right eval  Shoulder flexion 112  Shoulder abduction 92  Shoulder internal rotation 90  Shoulder external rotation 27  (Blank rows = not tested)  Assessed supine, er/IR adducted  Passive ROM Right eval  Shoulder flexion 140  Shoulder abduction 130  Shoulder internal rotation 90  Shoulder external rotation 25  (Blank rows = not tested)  UPPER EXTREMITY MMT:       Assessed on observation,  formal MMT not completed due to protocol/precautions  MMT Right eval  Shoulder flexion 3/5  Shoulder abduction 3/5  Shoulder internal rotation 3/5  Shoulder external rotation 3-/5  (Blank rows = not tested)  SENSATION: Occasional numbness around incision  OBSERVATIONS: Mod to max fascial restrictions along upper arm and anterior shoulder, mod restrictions in trapezius and scapular regions   TREATMENT DATE:   03/07/23 -Myofascial release and manual techniques to right upper arm, anterior shoulder, trapezius, and scapular regions to decrease pain and fascial restrictions and improve joint ROM -P/ROM: supine-flexion, abduction, er/IR, horizontal abduction, 5 reps -AA/ROM: supine-protraction, flexion, er/IR, abduction, horizontal abduction, 10 reps -Pulleys: 1' flexion, 1' abduction -Scapular A/ROM: row, extension, elevation/depression, 10 reps  03/04/23 -Myofascial release and manual techniques to right upper arm, anterior shoulder, trapezius, and scapular regions to decrease pain and fascial restrictions and improve joint ROM -P/ROM: supine-flexion, abduction, er/IR, horizontal abduction, 5 reps -AA/ROM: supine-protraction, flexion, er/IR, abduction, horizontal abduction, 10 reps -Scapular A/ROM: row, extension, elevation/depression, 10 reps -Isometrics: standing-flexion, extension, er, IR, abduction, adduction, 3x10 holds -Pulleys: 1' flexion -Therapy ball stretches: flexion, abduction, 10 reps  03/01/23 -P/ROM: supine-flexion, abduction, er/IR, horizontal abduction, 5 reps -Scapular A/ROM: row, extension, elevation/depression, 5 reps -Table slides: flexion, abduction, er, 5 reps                                                                                                                           PATIENT EDUCATION: Education details: AA/ROM Person educated: Patient Education method: Programmer, Multimedia, Facilities Manager, and Handouts Education comprehension: verbalized understanding  and returned demonstration  HOME EXERCISE PROGRAM: Eval: scapular A/ROM, table slides 1/9: AA/ROM  GOALS: Goals reviewed with patient? Yes  SHORT TERM GOALS: Target date: 03/22/23  Pt will be provided with and educated on HEP to improve mobility in RUE required for use during ADL completion.   Goal status: IN PROGRESS  2.  Pt will increase RUE P/ROM by 10+ degrees to improve ability to use RUE during dressing tasks with minimal compensatory techniques.   Goal status: IN PROGRESS    LONG TERM GOALS: Target date: 04/12/23  Pt will decrease pain in RUE to 3/10 or less to improve ability to sleep for 2+ consecutive hours without waking due to pain.   Goal status: IN PROGRESS  2.  Pt will decrease RUE  fascial restrictions to min amounts or less to improve mobility required for functional reaching tasks.   Goal status: IN PROGRESS  3.  Pt will increase RUE A/ROM by 35+ degrees to improve ability to use RUE when reaching overhead or behind back during dressing and bathing tasks.   Goal status: IN PROGRESS  4.  Pt will increase RUE strength to 4/5 or greater to improve ability to use RUE when lifting or carrying items during meal preparation/housework/yardwork tasks.   Goal status: IN PROGRESS  ASSESSMENT:  CLINICAL IMPRESSION: This session, pt reporting that her pain is very minimal. She is demonstrating good improvements with her ROM during AA/ROM, achieving approximately 75% of full ROM. She reports mild pain with abduction, otherwise no overt discomfort. OT providing verbal and tactile cuing throughout session for positioning and technique.   PERFORMANCE DEFICITS: in functional skills including ADLs, IADLs, ROM, strength, pain, fascial restrictions, muscle spasms, and UE functional use     PLAN:  OT FREQUENCY: 2x/week  OT DURATION: 6 weeks  PLANNED INTERVENTIONS: 97168 OT Re-evaluation, 97535 self care/ADL training, 02889 therapeutic exercise, 97530 therapeutic  activity, 97140 manual therapy, 97035 ultrasound, 97014 electrical stimulation unattended, patient/family education, and DME and/or AE instructions  CONSULTED AND AGREED WITH PLAN OF CARE: Patient  PLAN FOR NEXT SESSION: Continue with AA/ROM progressing to standing, add thumb tacks and wall wash. Update HEP to AA/ROM   Valentin Nightingale, OTR/L [phone] 03/07/2023, 12:14 PM

## 2023-03-07 NOTE — Patient Instructions (Signed)

## 2023-03-11 ENCOUNTER — Ambulatory Visit (HOSPITAL_COMMUNITY): Payer: No Typology Code available for payment source | Admitting: Occupational Therapy

## 2023-03-11 ENCOUNTER — Encounter (HOSPITAL_COMMUNITY): Payer: Self-pay | Admitting: Occupational Therapy

## 2023-03-11 DIAGNOSIS — R29898 Other symptoms and signs involving the musculoskeletal system: Secondary | ICD-10-CM

## 2023-03-11 DIAGNOSIS — M25611 Stiffness of right shoulder, not elsewhere classified: Secondary | ICD-10-CM

## 2023-03-11 DIAGNOSIS — M25511 Pain in right shoulder: Secondary | ICD-10-CM

## 2023-03-11 NOTE — Therapy (Signed)
 OUTPATIENT OCCUPATIONAL THERAPY ORTHO TREATMENT  Patient Name: Sharon [name] MRN: [id number] DOB:Jun 29, 1945, 78 y.o., female Today's Date: 03/11/2023    END OF SESSION:  OT End of Session - 03/11/23 1053     Visit Number 4    Number of Visits 12    Date for OT Re-Evaluation 04/12/23    Authorization Type [employer], $25 copay    Progress Note Due on Visit 10    OT Start Time 1012    OT Stop Time 1053    OT Time Calculation (min) 41 min    Activity Tolerance Patient tolerated treatment well    Behavior During Therapy WFL for tasks assessed/performed                Past Medical History:  Diagnosis Date   Arthritis    lower back   Dental crowns present    caps on top front teeth   Depression    Dyspnea    GERD (gastroesophageal reflux disease)    Hemorrhoids    Kidney stones    Past Surgical History:  Procedure Laterality Date   ABDOMINAL HYSTERECTOMY     ARTHOSCOPIC ROTAOR CUFF REPAIR     BREAST SURGERY     COLONOSCOPY WITH PROPOFOL  N/A 09/26/2015   Procedure: COLONOSCOPY WITH PROPOFOL ;  Surgeon: Rogelia Copping, MD;  Location: [facility];  Service: Endoscopy;  Laterality: N/A;   COLONOSCOPY WITH PROPOFOL  N/A 09/25/2021   Procedure: COLONOSCOPY WITH BIOPSIES;  Surgeon: Copping Rogelia, MD;  Location: [facility];  Service: Endoscopy;  Laterality: N/A;   LEG SURGERY     POLYPECTOMY  09/26/2015   Procedure: POLYPECTOMY;  Surgeon: Rogelia Copping, MD;  Location: [facility];  Service: Endoscopy;;   POLYPECTOMY N/A 09/25/2021   Procedure: POLYPECTOMY;  Surgeon: Copping Rogelia, MD;  Location: [facility];  Service: Endoscopy;  Laterality: N/A;   Patient Active Problem List   Diagnosis Date Noted   History of colonic polyps    Special screening for malignant neoplasms, colon    Benign neoplasm of ascending colon    Benign neoplasm of sigmoid colon    Mixed incontinence 03/16/2014   Calculus of kidney 03/16/2014   Renal colic 03/16/2014    Pain in shoulder 11/23/2013   Colon polyp 11/10/2013   Cough 11/10/2013   Clinical depression 11/10/2013   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 Alease Oyster  ONSET DATE: 02/06/23  REFERRING DIAG: s/p right TSA  THERAPY DIAG:  Acute pain of right shoulder  Stiffness of right shoulder, not elsewhere classified  Other symptoms and signs involving the musculoskeletal system  Rationale for Evaluation and Treatment: Rehabilitation  SUBJECTIVE:   SUBJECTIVE STATEMENT: S: It's my other shoulder that's bothering me today.   PERTINENT HISTORY: Pt is a 78 y/o female s/p right TSA on 02/06/23. Pt is not currently wearing sling. Did not have any HH therapy due to company scheduling. Pt with hx of RCR and bicep tendon repair 10+ years ago.   PRECAUTIONS: Shoulder   WEIGHT BEARING RESTRICTIONS: Yes NWB  PAIN:  Are you having pain? No  FALLS: Has patient fallen in last 6 months? No  PLOF: Independent  PATIENT GOALS: To improve reaching ability.   NEXT MD VISIT: approximately 6 weeks  OBJECTIVE:  Note: Objective measures were completed at Evaluation unless otherwise noted.  HAND DOMINANCE: Right  ADLs: Pt reports difficulty with reaching behind back and into cabinets. Pt has difficulty lifting items.  FUNCTIONAL OUTCOME MEASURES: FOTO: 58/100  UPPER EXTREMITY ROM:       Assessed seated, er/IR adducted  Active ROM Right eval  Shoulder flexion 112  Shoulder abduction 92  Shoulder internal rotation 90  Shoulder external rotation 27  (Blank rows = not tested)  Assessed supine, er/IR adducted  Passive ROM Right eval  Shoulder flexion 140  Shoulder abduction 130  Shoulder internal rotation 90  Shoulder external rotation 25  (Blank rows = not tested)  UPPER EXTREMITY MMT:       Assessed on observation, formal MMT not completed due to protocol/precautions  MMT Right eval  Shoulder flexion 3/5  Shoulder  abduction 3/5  Shoulder internal rotation 3/5  Shoulder external rotation 3-/5  (Blank rows = not tested)  SENSATION: Occasional numbness around incision  OBSERVATIONS: Mod to max fascial restrictions along upper arm and anterior shoulder, mod restrictions in trapezius and scapular regions   TREATMENT DATE:   03/11/23 -Myofascial release and manual techniques to right upper arm, anterior shoulder, trapezius, and scapular regions to decrease pain and fascial restrictions and improve joint ROM -P/ROM: supine-flexion, abduction, er/IR, horizontal abduction, 5 reps -AA/ROM: supine-protraction, flexion, er/IR, abduction, horizontal abduction, 10 reps -Scapular A/ROM: row, extension, elevation/depression, 10 reps -AA/ROM: standing-protraction, flexion, er/IR, abduction, horizontal abduction, 10 reps -Pulleys: 1' flexion,  03/07/23 -Myofascial release and manual techniques to right upper arm, anterior shoulder, trapezius, and scapular regions to decrease pain and fascial restrictions and improve joint ROM -P/ROM: supine-flexion, abduction, er/IR, horizontal abduction, 5 reps -AA/ROM: supine-protraction, flexion, er/IR, abduction, horizontal abduction, 10 reps -Pulleys: 1' flexion, 1' abduction -Scapular A/ROM: row, extension, elevation/depression, 10 reps  03/04/23 -Myofascial release and manual techniques to right upper arm, anterior shoulder, trapezius, and scapular regions to decrease pain and fascial restrictions and improve joint ROM -P/ROM: supine-flexion, abduction, er/IR, horizontal abduction, 5 reps -AA/ROM: supine-protraction, flexion, er/IR, abduction, horizontal abduction, 10 reps -Scapular A/ROM: row, extension, elevation/depression, 10 reps -Isometrics: standing-flexion, extension, er, IR, abduction, adduction, 3x10 holds -Pulleys: 1' flexion -Therapy ball stretches: flexion, abduction, 10 reps                                                                                                                      PATIENT EDUCATION: Education details: reviewed HEP Person educated: Patient Education method: Explanation, Demonstration, and Handouts Education comprehension: verbalized understanding and returned demonstration  HOME EXERCISE PROGRAM: Eval: scapular A/ROM, table slides 1/9: AA/ROM  GOALS: Goals reviewed with patient? Yes  SHORT TERM GOALS: Target date: 03/22/23  Pt will be provided with and educated on HEP to improve mobility in RUE required for use during ADL completion.   Goal status: IN PROGRESS  2.  Pt will increase RUE P/ROM by 10+ degrees to improve ability to use RUE during dressing tasks with minimal compensatory techniques.   Goal status: IN PROGRESS    LONG TERM GOALS: Target date: 04/12/23  Pt will decrease pain in RUE to 3/10 or less to improve ability to sleep for 2+ consecutive  hours without waking due to pain.   Goal status: IN PROGRESS  2.  Pt will decrease RUE fascial restrictions to min amounts or less to improve mobility required for functional reaching tasks.   Goal status: IN PROGRESS  3.  Pt will increase RUE A/ROM by 35+ degrees to improve ability to use RUE when reaching overhead or behind back during dressing and bathing tasks.   Goal status: IN PROGRESS  4.  Pt will increase RUE strength to 4/5 or greater to improve ability to use RUE when lifting or carrying items during meal preparation/housework/yardwork tasks.   Goal status: IN PROGRESS  ASSESSMENT:  CLINICAL IMPRESSION: Pt reports she can sleep on her right arm now. She is completing her HEP and it is going well. Continued with manual techniques, passive stretching with pt tolerating ROM to 75%. Continued with AA/ROM and added in standing. Mod difficulty with standing due to strength deficits limiting ROM.  In standing, ROM to approximately 60-65% range. Continued with pulleys. Verbal cuing for form and technique.   PERFORMANCE DEFICITS: in functional  skills including ADLs, IADLs, ROM, strength, pain, fascial restrictions, muscle spasms, and UE functional use     PLAN:  OT FREQUENCY: 2x/week  OT DURATION: 6 weeks  PLANNED INTERVENTIONS: 97168 OT Re-evaluation, 97535 self care/ADL training, 02889 therapeutic exercise, 97530 therapeutic activity, 97140 manual therapy, 97035 ultrasound, 97014 electrical stimulation unattended, patient/family education, and DME and/or AE instructions  CONSULTED AND AGREED WITH PLAN OF CARE: Patient  PLAN FOR NEXT SESSION: Continue with AA/ROM, add proximal shoulder strengthening   Sonny Cory, OTR/L  [phone] 03/11/2023, 10:54 AM

## 2023-03-14 ENCOUNTER — Ambulatory Visit (HOSPITAL_COMMUNITY): Payer: No Typology Code available for payment source | Admitting: Occupational Therapy

## 2023-03-14 ENCOUNTER — Encounter (HOSPITAL_COMMUNITY): Payer: Self-pay | Admitting: Occupational Therapy

## 2023-03-14 DIAGNOSIS — M25611 Stiffness of right shoulder, not elsewhere classified: Secondary | ICD-10-CM

## 2023-03-14 DIAGNOSIS — M25511 Pain in right shoulder: Secondary | ICD-10-CM | POA: Diagnosis not present

## 2023-03-14 DIAGNOSIS — R29898 Other symptoms and signs involving the musculoskeletal system: Secondary | ICD-10-CM

## 2023-03-14 NOTE — Therapy (Signed)
OUTPATIENT OCCUPATIONAL THERAPY ORTHO TREATMENT  Patient Name: Sharon [name] MRN: [id number] DOB:1945-09-16, 78 y.o., female Today's Date: 03/15/2023    END OF SESSION:  OT End of Session - 03/14/23 1106     Visit Number 5    Number of Visits 12    Date for OT Re-Evaluation 04/12/23    Authorization Type [employer], $25 copay    Progress Note Due on Visit 10    OT Start Time 1024    OT Stop Time 1106    OT Time Calculation (min) 42 min    Activity Tolerance Patient tolerated treatment well    Behavior During Therapy WFL for tasks assessed/performed             Past Medical History:  Diagnosis Date   Arthritis    lower back   Dental crowns present    caps on top front teeth   Depression    Dyspnea    GERD (gastroesophageal reflux disease)    Hemorrhoids    Kidney stones    Past Surgical History:  Procedure Laterality Date   ABDOMINAL HYSTERECTOMY     ARTHOSCOPIC ROTAOR CUFF REPAIR     BREAST SURGERY     COLONOSCOPY WITH PROPOFOL N/A 09/26/2015   Procedure: COLONOSCOPY WITH PROPOFOL;  Surgeon: Midge Minium, MD;  Location: [facility];  Service: Endoscopy;  Laterality: N/A;   COLONOSCOPY WITH PROPOFOL N/A 09/25/2021   Procedure: COLONOSCOPY WITH BIOPSIES;  Surgeon: Midge Minium, MD;  Location: [facility];  Service: Endoscopy;  Laterality: N/A;   LEG SURGERY     POLYPECTOMY  09/26/2015   Procedure: POLYPECTOMY;  Surgeon: Midge Minium, MD;  Location: [facility];  Service: Endoscopy;;   POLYPECTOMY N/A 09/25/2021   Procedure: POLYPECTOMY;  Surgeon: Midge Minium, MD;  Location: [facility];  Service: Endoscopy;  Laterality: N/A;   Patient Active Problem List   Diagnosis Date Noted   History of colonic polyps    Special screening for malignant neoplasms, colon    Benign neoplasm of ascending colon    Benign neoplasm of sigmoid colon    Mixed incontinence 03/16/2014   Calculus of kidney 03/16/2014   Renal colic 03/16/2014   Pain  in shoulder 11/23/2013   Colon polyp 11/10/2013   Cough 11/10/2013   Clinical depression 11/10/2013   Bloodgood disease 11/10/2013   PCP: Riley Kill, PA-C REFERRING PROVIDER: Dr. Carin Primrose  ONSET DATE: 02/06/23  REFERRING DIAG: s/p right TSA  THERAPY DIAG:  Acute pain of right shoulder  Stiffness of right shoulder, not elsewhere classified  Other symptoms and signs involving the musculoskeletal system  Rationale for Evaluation and Treatment: Rehabilitation  SUBJECTIVE:   SUBJECTIVE STATEMENT: S: "I'm feeling pretty good today."   PERTINENT HISTORY: Pt is a 78 y/o female s/p right TSA on 02/06/23. Pt is not currently wearing sling. Did not have any HH therapy due to company scheduling. Pt with hx of RCR and bicep tendon repair 10+ years ago.   PRECAUTIONS: Shoulder   WEIGHT BEARING RESTRICTIONS: Yes NWB  PAIN:  Are you having pain? No  FALLS: Has patient fallen in last 6 months? No  PLOF: Independent  PATIENT GOALS: To improve reaching ability.   NEXT MD VISIT: approximately 6 weeks  OBJECTIVE:  Note: Objective measures were completed at Evaluation unless otherwise noted.  HAND DOMINANCE: Right  ADLs: Pt reports difficulty with reaching behind back and into cabinets. Pt has difficulty lifting items.    FUNCTIONAL OUTCOME MEASURES:  FOTO: 58/100  UPPER EXTREMITY ROM:       Assessed seated, er/IR adducted  Active ROM Right eval  Shoulder flexion 112  Shoulder abduction 92  Shoulder internal rotation 90  Shoulder external rotation 27  (Blank rows = not tested)  Assessed supine, er/IR adducted  Passive ROM Right eval  Shoulder flexion 140  Shoulder abduction 130  Shoulder internal rotation 90  Shoulder external rotation 25  (Blank rows = not tested)  UPPER EXTREMITY MMT:       Assessed on observation, formal MMT not completed due to protocol/precautions  MMT Right eval  Shoulder flexion 3/5  Shoulder abduction 3/5  Shoulder  internal rotation 3/5  Shoulder external rotation 3-/5  (Blank rows = not tested)  SENSATION: Occasional numbness around incision  OBSERVATIONS: Mod to max fascial restrictions along upper arm and anterior shoulder, mod restrictions in trapezius and scapular regions   TREATMENT DATE:   03/14/23 -Myofascial release and manual techniques to right upper arm, anterior shoulder, trapezius, and scapular regions to decrease pain and fascial restrictions and improve joint ROM -AA/ROM: supine-protraction, flexion, er/IR, abduction, horizontal abduction, x15 -Scapular A/ROM: row, extension, elevation/depression, x15 -Low level wall wash, 2x60" -Thumb tacs, 2x60" -AA/ROM: standing-protraction, flexion, er/IR, abduction, horizontal abduction, x10 -Wall Slides: flexion, abduction, x10  03/11/23 -Myofascial release and manual techniques to right upper arm, anterior shoulder, trapezius, and scapular regions to decrease pain and fascial restrictions and improve joint ROM -P/ROM: supine-flexion, abduction, er/IR, horizontal abduction, 5 reps -AA/ROM: supine-protraction, flexion, er/IR, abduction, horizontal abduction, 10 reps -Scapular A/ROM: row, extension, elevation/depression, 10 reps -AA/ROM: standing-protraction, flexion, er/IR, abduction, horizontal abduction, 10 reps -Pulleys: 1' flexion,  03/07/23 -Myofascial release and manual techniques to right upper arm, anterior shoulder, trapezius, and scapular regions to decrease pain and fascial restrictions and improve joint ROM -P/ROM: supine-flexion, abduction, er/IR, horizontal abduction, 5 reps -AA/ROM: supine-protraction, flexion, er/IR, abduction, horizontal abduction, 10 reps -Pulleys: 1' flexion, 1' abduction -Scapular A/ROM: row, extension, elevation/depression, 10 reps                                  PATIENT EDUCATION: Education details: reviewed HEP Person educated: Patient Education method: Explanation, Demonstration, and  Handouts Education comprehension: verbalized understanding and returned demonstration  HOME EXERCISE PROGRAM: Eval: scapular A/ROM, table slides 1/9: AA/ROM  GOALS: Goals reviewed with patient? Yes  SHORT TERM GOALS: Target date: 03/22/23  Pt will be provided with and educated on HEP to improve mobility in RUE required for use during ADL completion.   Goal status: IN PROGRESS  2.  Pt will increase RUE P/ROM by 10+ degrees to improve ability to use RUE during dressing tasks with minimal compensatory techniques.   Goal status: IN PROGRESS    LONG TERM GOALS: Target date: 04/12/23  Pt will decrease pain in RUE to 3/10 or less to improve ability to sleep for 2+ consecutive hours without waking due to pain.   Goal status: IN PROGRESS  2.  Pt will decrease RUE fascial restrictions to min amounts or less to improve mobility required for functional reaching tasks.   Goal status: IN PROGRESS  3.  Pt will increase RUE A/ROM by 35+ degrees to improve ability to use RUE when reaching overhead or behind back during dressing and bathing tasks.   Goal status: IN PROGRESS  4.  Pt will increase RUE strength to 4/5 or greater to improve ability to use RUE when lifting  or carrying items during meal preparation/housework/yardwork tasks.   Goal status: IN PROGRESS  ASSESSMENT:  CLINICAL IMPRESSION: This session pt continuing to make good improvements with her range and continued minimal pain. She is tolerating increasing repetitions and exercises this session. OT providing verbal and tactile cuing for positioning and technique throughout session.   PERFORMANCE DEFICITS: in functional skills including ADLs, IADLs, ROM, strength, pain, fascial restrictions, muscle spasms, and UE functional use     PLAN:  OT FREQUENCY: 2x/week  OT DURATION: 6 weeks  PLANNED INTERVENTIONS: 97168 OT Re-evaluation, 97535 self care/ADL training, 10626 therapeutic exercise, 97530 therapeutic activity, 97140  manual therapy, 97035 ultrasound, 97014 electrical stimulation unattended, patient/family education, and DME and/or AE instructions  CONSULTED AND AGREED WITH PLAN OF CARE: Patient  PLAN FOR NEXT SESSION: Continue with AA/ROM, add proximal shoulder strengthening   Trish Mage, OTR/L [phone] 03/15/2023, 12:55 PM

## 2023-03-18 ENCOUNTER — Encounter (HOSPITAL_COMMUNITY): Payer: Medicare HMO | Admitting: Occupational Therapy

## 2023-03-21 ENCOUNTER — Ambulatory Visit (HOSPITAL_COMMUNITY): Payer: No Typology Code available for payment source | Admitting: Occupational Therapy

## 2023-03-21 ENCOUNTER — Encounter (HOSPITAL_COMMUNITY): Payer: Self-pay | Admitting: Occupational Therapy

## 2023-03-21 DIAGNOSIS — M25611 Stiffness of right shoulder, not elsewhere classified: Secondary | ICD-10-CM

## 2023-03-21 DIAGNOSIS — R29898 Other symptoms and signs involving the musculoskeletal system: Secondary | ICD-10-CM

## 2023-03-21 DIAGNOSIS — M25511 Pain in right shoulder: Secondary | ICD-10-CM | POA: Diagnosis not present

## 2023-03-21 NOTE — Therapy (Signed)
OUTPATIENT OCCUPATIONAL THERAPY ORTHO TREATMENT  Patient Name: Sharon [name] MRN: [id number] DOB:16-Sep-1945, 78 y.o., female Today's Date: 03/21/2023    END OF SESSION:  OT End of Session - 03/21/23 1102     Visit Number 6    Number of Visits 12    Date for OT Re-Evaluation 04/12/23    Authorization Type [employer], $25 copay    Progress Note Due on Visit 10    OT Start Time 1024    OT Stop Time 1102    OT Time Calculation (min) 38 min    Activity Tolerance Patient tolerated treatment well    Behavior During Therapy WFL for tasks assessed/performed             Past Medical History:  Diagnosis Date   Arthritis    lower back   Dental crowns present    caps on top front teeth   Depression    Dyspnea    GERD (gastroesophageal reflux disease)    Hemorrhoids    Kidney stones    Past Surgical History:  Procedure Laterality Date   ABDOMINAL HYSTERECTOMY     ARTHOSCOPIC ROTAOR CUFF REPAIR     BREAST SURGERY     COLONOSCOPY WITH PROPOFOL N/A 09/26/2015   Procedure: COLONOSCOPY WITH PROPOFOL;  Surgeon: Midge Minium, MD;  Location: [facility];  Service: Endoscopy;  Laterality: N/A;   COLONOSCOPY WITH PROPOFOL N/A 09/25/2021   Procedure: COLONOSCOPY WITH BIOPSIES;  Surgeon: Midge Minium, MD;  Location: [facility];  Service: Endoscopy;  Laterality: N/A;   LEG SURGERY     POLYPECTOMY  09/26/2015   Procedure: POLYPECTOMY;  Surgeon: Midge Minium, MD;  Location: [facility];  Service: Endoscopy;;   POLYPECTOMY N/A 09/25/2021   Procedure: POLYPECTOMY;  Surgeon: Midge Minium, MD;  Location: [facility];  Service: Endoscopy;  Laterality: N/A;   Patient Active Problem List   Diagnosis Date Noted   History of colonic polyps    Special screening for malignant neoplasms, colon    Benign neoplasm of ascending colon    Benign neoplasm of sigmoid colon    Mixed incontinence 03/16/2014   Calculus of kidney 03/16/2014   Renal colic 03/16/2014   Pain  in shoulder 11/23/2013   Colon polyp 11/10/2013   Cough 11/10/2013   Clinical depression 11/10/2013   Bloodgood disease 11/10/2013   PCP: Riley Kill, PA-C REFERRING PROVIDER: Dr. Carin Primrose  ONSET DATE: 02/06/23  REFERRING DIAG: s/p right TSA  THERAPY DIAG:  Acute pain of right shoulder  Stiffness of right shoulder, not elsewhere classified  Other symptoms and signs involving the musculoskeletal system  Rationale for Evaluation and Treatment: Rehabilitation  SUBJECTIVE:   SUBJECTIVE STATEMENT: S: "I haven't been having any pain"   PERTINENT HISTORY: Pt is a 78 y/o female s/p right TSA on 02/06/23. Pt is not currently wearing sling. Did not have any HH therapy due to company scheduling. Pt with hx of RCR and bicep tendon repair 10+ years ago.   PRECAUTIONS: Shoulder   WEIGHT BEARING RESTRICTIONS: Yes NWB  PAIN:  Are you having pain? No  FALLS: Has patient fallen in last 6 months? No  PLOF: Independent  PATIENT GOALS: To improve reaching ability.   NEXT MD VISIT: approximately 6 weeks  OBJECTIVE:  Note: Objective measures were completed at Evaluation unless otherwise noted.  HAND DOMINANCE: Right  ADLs: Pt reports difficulty with reaching behind back and into cabinets. Pt has difficulty lifting items.    FUNCTIONAL OUTCOME  MEASURES: FOTO: 58/100  UPPER EXTREMITY ROM:       Assessed seated, er/IR adducted  Active ROM Right eval  Shoulder flexion 112  Shoulder abduction 92  Shoulder internal rotation 90  Shoulder external rotation 27  (Blank rows = not tested)  Assessed supine, er/IR adducted  Passive ROM Right eval  Shoulder flexion 140  Shoulder abduction 130  Shoulder internal rotation 90  Shoulder external rotation 25  (Blank rows = not tested)  UPPER EXTREMITY MMT:       Assessed on observation, formal MMT not completed due to protocol/precautions  MMT Right eval  Shoulder flexion 3/5  Shoulder abduction 3/5  Shoulder  internal rotation 3/5  Shoulder external rotation 3-/5  (Blank rows = not tested)  SENSATION: Occasional numbness around incision  OBSERVATIONS: Mod to max fascial restrictions along upper arm and anterior shoulder, mod restrictions in trapezius and scapular regions   TREATMENT DATE:   03/21/23 -Myofascial release and manual techniques to right upper arm, anterior shoulder, trapezius, and scapular regions to decrease pain and fascial restrictions and improve joint ROM -AA/ROM: supine-protraction, flexion, er/IR, abduction, horizontal abduction, x12 -A/ROM: supine, flexion, abduction, protraction, horizontal abduction, er/IR, x10 -functional reaching: 1#, shelves 1 and 2 x10 each -Scapular Strengthening: extension, retraction, rows, x10 -UBE: level 1, 2.5' forwards and backwards, 3.0+ kph  03/14/23 -Myofascial release and manual techniques to right upper arm, anterior shoulder, trapezius, and scapular regions to decrease pain and fascial restrictions and improve joint ROM -AA/ROM: supine-protraction, flexion, er/IR, abduction, horizontal abduction, x15 -Scapular A/ROM: row, extension, elevation/depression, x15 -Low level wall wash, 2x60" -Thumb tacs, 2x60" -AA/ROM: standing-protraction, flexion, er/IR, abduction, horizontal abduction, x10 -Wall Slides: flexion, abduction, x10  03/11/23 -Myofascial release and manual techniques to right upper arm, anterior shoulder, trapezius, and scapular regions to decrease pain and fascial restrictions and improve joint ROM -P/ROM: supine-flexion, abduction, er/IR, horizontal abduction, 5 reps -AA/ROM: supine-protraction, flexion, er/IR, abduction, horizontal abduction, 10 reps -Scapular A/ROM: row, extension, elevation/depression, 10 reps -AA/ROM: standing-protraction, flexion, er/IR, abduction, horizontal abduction, 10 reps -Pulleys: 1' flexion,    PATIENT EDUCATION: Education details: A/ROM Person educated: Patient Education method:  Programmer, multimedia, Demonstration, and Handouts Education comprehension: verbalized understanding and returned demonstration  HOME EXERCISE PROGRAM: Eval: scapular A/ROM, table slides 1/9: AA/ROM 1/23: A/ROM  GOALS: Goals reviewed with patient? Yes  SHORT TERM GOALS: Target date: 03/22/23  Pt will be provided with and educated on HEP to improve mobility in RUE required for use during ADL completion.   Goal status: IN PROGRESS  2.  Pt will increase RUE P/ROM by 10+ degrees to improve ability to use RUE during dressing tasks with minimal compensatory techniques.   Goal status: IN PROGRESS    LONG TERM GOALS: Target date: 04/12/23  Pt will decrease pain in RUE to 3/10 or less to improve ability to sleep for 2+ consecutive hours without waking due to pain.   Goal status: IN PROGRESS  2.  Pt will decrease RUE fascial restrictions to min amounts or less to improve mobility required for functional reaching tasks.   Goal status: IN PROGRESS  3.  Pt will increase RUE A/ROM by 35+ degrees to improve ability to use RUE when reaching overhead or behind back during dressing and bathing tasks.   Goal status: IN PROGRESS  4.  Pt will increase RUE strength to 4/5 or greater to improve ability to use RUE when lifting or carrying items during meal preparation/housework/yardwork tasks.   Goal status: IN PROGRESS  ASSESSMENT:  CLINICAL IMPRESSION: Pt presenting to session with no pain and good improvements with ROM. She is able to achieve full ROM with AA/ROM and 80% of full ROM with A/ROM in supine. With active movements this session, pt was having mild tremors due to increased effort, overall tolerating well. Pt was also able to start scapular strengthening this sessio with no increase in pain or discomfort. Verbal and tactile cuing provided for positioning and technique throughout session.   PERFORMANCE DEFICITS: in functional skills including ADLs, IADLs, ROM, strength, pain, fascial  restrictions, muscle spasms, and UE functional use     PLAN:  OT FREQUENCY: 2x/week  OT DURATION: 6 weeks  PLANNED INTERVENTIONS: 97168 OT Re-evaluation, 97535 self care/ADL training, 29518 therapeutic exercise, 97530 therapeutic activity, 97140 manual therapy, 97035 ultrasound, 97014 electrical stimulation unattended, patient/family education, and DME and/or AE instructions  CONSULTED AND AGREED WITH PLAN OF CARE: Patient  PLAN FOR NEXT SESSION: Continue with AA/ROM, add proximal shoulder strengthening   Trish Mage, OTR/L [phone] 03/21/2023, 11:03 AM

## 2023-03-21 NOTE — Patient Instructions (Signed)

## 2023-03-25 ENCOUNTER — Ambulatory Visit (HOSPITAL_COMMUNITY): Payer: No Typology Code available for payment source | Admitting: Occupational Therapy

## 2023-03-25 ENCOUNTER — Encounter (HOSPITAL_COMMUNITY): Payer: Self-pay | Admitting: Occupational Therapy

## 2023-03-25 DIAGNOSIS — M25511 Pain in right shoulder: Secondary | ICD-10-CM | POA: Diagnosis not present

## 2023-03-25 DIAGNOSIS — R29898 Other symptoms and signs involving the musculoskeletal system: Secondary | ICD-10-CM

## 2023-03-25 DIAGNOSIS — M25611 Stiffness of right shoulder, not elsewhere classified: Secondary | ICD-10-CM

## 2023-03-25 NOTE — Therapy (Signed)
OUTPATIENT OCCUPATIONAL THERAPY ORTHO TREATMENT  Patient Name: Sharon [name] MRN: [id number] DOB:1945/11/30, 78 y.o., female Today's Date: 03/25/2023    END OF SESSION:  OT End of Session - 03/25/23 1105     Visit Number 7    Number of Visits 12    Date for OT Re-Evaluation 04/12/23    Authorization Type [employer], $25 copay    Progress Note Due on Visit 10    OT Start Time 1025    OT Stop Time 1103    OT Time Calculation (min) 38 min    Activity Tolerance Patient tolerated treatment well    Behavior During Therapy WFL for tasks assessed/performed              Past Medical History:  Diagnosis Date   Arthritis    lower back   Dental crowns present    caps on top front teeth   Depression    Dyspnea    GERD (gastroesophageal reflux disease)    Hemorrhoids    Kidney stones    Past Surgical History:  Procedure Laterality Date   ABDOMINAL HYSTERECTOMY     ARTHOSCOPIC ROTAOR CUFF REPAIR     BREAST SURGERY     COLONOSCOPY WITH PROPOFOL N/A 09/26/2015   Procedure: COLONOSCOPY WITH PROPOFOL;  Surgeon: Midge Minium, MD;  Location: [facility];  Service: Endoscopy;  Laterality: N/A;   COLONOSCOPY WITH PROPOFOL N/A 09/25/2021   Procedure: COLONOSCOPY WITH BIOPSIES;  Surgeon: Midge Minium, MD;  Location: [facility];  Service: Endoscopy;  Laterality: N/A;   LEG SURGERY     POLYPECTOMY  09/26/2015   Procedure: POLYPECTOMY;  Surgeon: Midge Minium, MD;  Location: [facility];  Service: Endoscopy;;   POLYPECTOMY N/A 09/25/2021   Procedure: POLYPECTOMY;  Surgeon: Midge Minium, MD;  Location: [facility];  Service: Endoscopy;  Laterality: N/A;   Patient Active Problem List   Diagnosis Date Noted   History of colonic polyps    Special screening for malignant neoplasms, colon    Benign neoplasm of ascending colon    Benign neoplasm of sigmoid colon    Mixed incontinence 03/16/2014   Calculus of kidney 03/16/2014   Renal colic 03/16/2014    Pain in shoulder 11/23/2013   Colon polyp 11/10/2013   Cough 11/10/2013   Clinical depression 11/10/2013   Bloodgood disease 11/10/2013   PCP: Riley Kill, PA-C REFERRING PROVIDER: Dr. Carin Primrose  ONSET DATE: 02/06/23  REFERRING DIAG: s/p right TSA  THERAPY DIAG:  Acute pain of right shoulder  Stiffness of right shoulder, not elsewhere classified  Other symptoms and signs involving the musculoskeletal system  Rationale for Evaluation and Treatment: Rehabilitation  SUBJECTIVE:   SUBJECTIVE STATEMENT: S: "Last night the incision was hurting really bad"   PERTINENT HISTORY: Pt is a 78 y/o female s/p right TSA on 02/06/23. Pt is not currently wearing sling. Did not have any HH therapy due to company scheduling. Pt with hx of RCR and bicep tendon repair 10+ years ago.   PRECAUTIONS: Shoulder   WEIGHT BEARING RESTRICTIONS: Yes NWB  PAIN:  Are you having pain? No  FALLS: Has patient fallen in last 6 months? No  PLOF: Independent  PATIENT GOALS: To improve reaching ability.   NEXT MD VISIT: approximately 6 weeks  OBJECTIVE:  Note: Objective measures were completed at Evaluation unless otherwise noted.  HAND DOMINANCE: Right  ADLs: Pt reports difficulty with reaching behind back and into cabinets. Pt has difficulty lifting items.  FUNCTIONAL OUTCOME MEASURES: FOTO: 58/100  UPPER EXTREMITY ROM:       Assessed seated, er/IR adducted  Active ROM Right eval  Shoulder flexion 112  Shoulder abduction 92  Shoulder internal rotation 90  Shoulder external rotation 27  (Blank rows = not tested)  Assessed supine, er/IR adducted  Passive ROM Right eval  Shoulder flexion 140  Shoulder abduction 130  Shoulder internal rotation 90  Shoulder external rotation 25  (Blank rows = not tested)  UPPER EXTREMITY MMT:       Assessed on observation, formal MMT not completed due to protocol/precautions  MMT Right eval  Shoulder flexion 3/5  Shoulder abduction  3/5  Shoulder internal rotation 3/5  Shoulder external rotation 3-/5  (Blank rows = not tested)  SENSATION: Occasional numbness around incision  OBSERVATIONS: Mod to max fascial restrictions along upper arm and anterior shoulder, mod restrictions in trapezius and scapular regions   TREATMENT DATE:   03/25/23 -Myofascial release and manual techniques to right upper arm, anterior shoulder, trapezius, and scapular regions to decrease pain and fascial restrictions and improve joint ROM -AA/ROM: supine-protraction, flexion, er/IR, abduction, horizontal abduction, x12 -A/ROM: seated, flexion, abduction, protraction, horizontal abduction, er/IR, x12 -X to V arms, x10 -Goal Post arms, x10 -Proximal Shoulder Strengthening: paddles, criss cross, circles both directions., x10 -PNF Strengthening: red band, chest pulls, overhead pulls, er pulls, PNF up, PNF down, x10  03/21/23 -Myofascial release and manual techniques to right upper arm, anterior shoulder, trapezius, and scapular regions to decrease pain and fascial restrictions and improve joint ROM -AA/ROM: supine-protraction, flexion, er/IR, abduction, horizontal abduction, x12 -A/ROM: supine, flexion, abduction, protraction, horizontal abduction, er/IR, x10 -functional reaching: 1#, shelves 1 and 2 x10 each -Scapular Strengthening: extension, retraction, rows, x10 -UBE: level 1, 2.5' forwards and backwards, 3.0+ kph  03/14/23 -Myofascial release and manual techniques to right upper arm, anterior shoulder, trapezius, and scapular regions to decrease pain and fascial restrictions and improve joint ROM -AA/ROM: supine-protraction, flexion, er/IR, abduction, horizontal abduction, x15 -Scapular A/ROM: row, extension, elevation/depression, x15 -Low level wall wash, 2x60" -Thumb tacs, 2x60" -AA/ROM: standing-protraction, flexion, er/IR, abduction, horizontal abduction, x10 -Wall Slides: flexion, abduction, x10   PATIENT EDUCATION: Education  details: Publishing rights manager Person educated: Patient Education method: Explanation, Demonstration, and Handouts Education comprehension: verbalized understanding and returned demonstration  HOME EXERCISE PROGRAM: Eval: scapular A/ROM, table slides 1/9: AA/ROM 1/23: A/ROM 1/27: Scapular Strengthening  GOALS: Goals reviewed with patient? Yes  SHORT TERM GOALS: Target date: 03/22/23  Pt will be provided with and educated on HEP to improve mobility in RUE required for use during ADL completion.   Goal status: IN PROGRESS  2.  Pt will increase RUE P/ROM by 10+ degrees to improve ability to use RUE during dressing tasks with minimal compensatory techniques.   Goal status: IN PROGRESS    LONG TERM GOALS: Target date: 04/12/23  Pt will decrease pain in RUE to 3/10 or less to improve ability to sleep for 2+ consecutive hours without waking due to pain.   Goal status: IN PROGRESS  2.  Pt will decrease RUE fascial restrictions to min amounts or less to improve mobility required for functional reaching tasks.   Goal status: IN PROGRESS  3.  Pt will increase RUE A/ROM by 35+ degrees to improve ability to use RUE when reaching overhead or behind back during dressing and bathing tasks.   Goal status: IN PROGRESS  4.  Pt will increase RUE strength to 4/5 or greater to improve ability to  use RUE when lifting or carrying items during meal preparation/housework/yardwork tasks.   Goal status: IN PROGRESS  ASSESSMENT:  CLINICAL IMPRESSION: This session, pt continuing to have improved ROM and overall strength. She is achieving approximately 65-70% of full active ROM while sitting. Due to improved ROM and tolerance, OT added X to V arms, goal post arms, and PNF strengthening to continue working on functional arm movements and strengthening. Pt tolerated all exercises well, requiring short rest breaks after each set of exercises due to muscle fatigue and mild increase in pain/muscle  tightness. Verbal and tactile cuing provided for positioning and technique.   PERFORMANCE DEFICITS: in functional skills including ADLs, IADLs, ROM, strength, pain, fascial restrictions, muscle spasms, and UE functional use     PLAN:  OT FREQUENCY: 2x/week  OT DURATION: 6 weeks  PLANNED INTERVENTIONS: 97168 OT Re-evaluation, 97535 self care/ADL training, 16109 therapeutic exercise, 97530 therapeutic activity, 97140 manual therapy, 97035 ultrasound, 97014 electrical stimulation unattended, patient/family education, and DME and/or AE instructions  CONSULTED AND AGREED WITH PLAN OF CARE: Patient  PLAN FOR NEXT SESSION: Continue with AA/ROM, add proximal shoulder strengthening   Trish Mage, OTR/L [phone] 03/25/2023, 11:10 AM

## 2023-03-25 NOTE — Patient Instructions (Signed)

## 2023-03-28 ENCOUNTER — Encounter (HOSPITAL_COMMUNITY): Payer: Self-pay | Admitting: Occupational Therapy

## 2023-03-28 ENCOUNTER — Ambulatory Visit (HOSPITAL_COMMUNITY): Payer: No Typology Code available for payment source | Admitting: Occupational Therapy

## 2023-03-28 ENCOUNTER — Encounter (HOSPITAL_COMMUNITY): Payer: Medicare HMO | Admitting: Occupational Therapy

## 2023-03-28 DIAGNOSIS — M25511 Pain in right shoulder: Secondary | ICD-10-CM

## 2023-03-28 DIAGNOSIS — M25611 Stiffness of right shoulder, not elsewhere classified: Secondary | ICD-10-CM

## 2023-03-28 DIAGNOSIS — R29898 Other symptoms and signs involving the musculoskeletal system: Secondary | ICD-10-CM

## 2023-03-28 NOTE — Therapy (Signed)
OUTPATIENT OCCUPATIONAL THERAPY ORTHO TREATMENT  Patient Name: Sharon [name] MRN: [id number] DOB:September 17, 1945, 78 y.o., female Today's Date: 03/28/2023    END OF SESSION:  OT End of Session - 03/28/23 1229     Visit Number 8    Number of Visits 12    Date for OT Re-Evaluation 04/12/23    Authorization Type [employer], $25 copay    Progress Note Due on Visit 10    OT Start Time 1145    OT Stop Time 1228    OT Time Calculation (min) 43 min    Activity Tolerance Patient tolerated treatment well    Behavior During Therapy WFL for tasks assessed/performed               Past Medical History:  Diagnosis Date   Arthritis    lower back   Dental crowns present    caps on top front teeth   Depression    Dyspnea    GERD (gastroesophageal reflux disease)    Hemorrhoids    Kidney stones    Past Surgical History:  Procedure Laterality Date   ABDOMINAL HYSTERECTOMY     ARTHOSCOPIC ROTAOR CUFF REPAIR     BREAST SURGERY     COLONOSCOPY WITH PROPOFOL N/A 09/26/2015   Procedure: COLONOSCOPY WITH PROPOFOL;  Surgeon: Midge Minium, MD;  Location: [facility];  Service: Endoscopy;  Laterality: N/A;   COLONOSCOPY WITH PROPOFOL N/A 09/25/2021   Procedure: COLONOSCOPY WITH BIOPSIES;  Surgeon: Midge Minium, MD;  Location: [facility];  Service: Endoscopy;  Laterality: N/A;   LEG SURGERY     POLYPECTOMY  09/26/2015   Procedure: POLYPECTOMY;  Surgeon: Midge Minium, MD;  Location: [facility];  Service: Endoscopy;;   POLYPECTOMY N/A 09/25/2021   Procedure: POLYPECTOMY;  Surgeon: Midge Minium, MD;  Location: [facility];  Service: Endoscopy;  Laterality: N/A;   Patient Active Problem List   Diagnosis Date Noted   History of colonic polyps    Special screening for malignant neoplasms, colon    Benign neoplasm of ascending colon    Benign neoplasm of sigmoid colon    Mixed incontinence 03/16/2014   Calculus of kidney 03/16/2014   Renal colic 03/16/2014    Pain in shoulder 11/23/2013   Colon polyp 11/10/2013   Cough 11/10/2013   Clinical depression 11/10/2013   Bloodgood disease 11/10/2013   PCP: Riley Kill, PA-C REFERRING PROVIDER: Dr. Carin Primrose  ONSET DATE: 02/06/23  REFERRING DIAG: s/p right TSA  THERAPY DIAG:  Acute pain of right shoulder  Stiffness of right shoulder, not elsewhere classified  Other symptoms and signs involving the musculoskeletal system  Rationale for Evaluation and Treatment: Rehabilitation  SUBJECTIVE:   SUBJECTIVE STATEMENT: S: "I slept with a heating pad on it last night."   PERTINENT HISTORY: Pt is a 78 y/o female s/p right TSA on 02/06/23. Pt is not currently wearing sling. Did not have any HH therapy due to company scheduling. Pt with hx of RCR and bicep tendon repair 10+ years ago.   PRECAUTIONS: Shoulder   WEIGHT BEARING RESTRICTIONS: Yes NWB  PAIN:  Are you having pain? No  FALLS: Has patient fallen in last 6 months? No  PLOF: Independent  PATIENT GOALS: To improve reaching ability.   NEXT MD VISIT: approximately 6 weeks  OBJECTIVE:  Note: Objective measures were completed at Evaluation unless otherwise noted.  HAND DOMINANCE: Right  ADLs: Pt reports difficulty with reaching behind back and into cabinets. Pt has difficulty lifting  items.    FUNCTIONAL OUTCOME MEASURES: FOTO: 58/100  UPPER EXTREMITY ROM:       Assessed seated, er/IR adducted  Active ROM Right eval  Shoulder flexion 112  Shoulder abduction 92  Shoulder internal rotation 90  Shoulder external rotation 27  (Blank rows = not tested)  Assessed supine, er/IR adducted  Passive ROM Right eval  Shoulder flexion 140  Shoulder abduction 130  Shoulder internal rotation 90  Shoulder external rotation 25  (Blank rows = not tested)  UPPER EXTREMITY MMT:       Assessed on observation, formal MMT not completed due to protocol/precautions  MMT Right eval  Shoulder flexion 3/5  Shoulder abduction  3/5  Shoulder internal rotation 3/5  Shoulder external rotation 3-/5  (Blank rows = not tested)  SENSATION: Occasional numbness around incision  OBSERVATIONS: Mod to max fascial restrictions along upper arm and anterior shoulder, mod restrictions in trapezius and scapular regions   TREATMENT DATE:  03/28/23 -Myofascial release and manual techniques to right upper arm, anterior shoulder, trapezius, and scapular regions to decrease pain and fascial restrictions and improve joint ROM -P/ROM: supine-flexion, abduction, er, horizontal abduction, 5 reps -A/ROM: supine-flexion, abduction, protraction, horizontal abduction, er/IR, 10 reps -Proximal Shoulder Strengthening: supine-paddles, criss cross, circles both directions., 10 reps each -Shoulder stretches: flexion, doorway stretch, IR behind back with horizontal towel, 2x10" holds -Ball pass behind back for IR, 10 reps -Functional reaching: pt placing 10 cones on top shelf of overhead cabinet in flexion, removing in abduction -Overhead lacing: seated-lacing from top down then reversing -UBE: Level 1, 3' forward, 3' reverse, pac: 5.0  03/25/23 -Myofascial release and manual techniques to right upper arm, anterior shoulder, trapezius, and scapular regions to decrease pain and fascial restrictions and improve joint ROM -AA/ROM: supine-protraction, flexion, er/IR, abduction, horizontal abduction, x12 -A/ROM: seated, flexion, abduction, protraction, horizontal abduction, er/IR, x12 -X to V arms, x10 -Goal Post arms, x10 -Proximal Shoulder Strengthening: paddles, criss cross, circles both directions., x10 -PNF Strengthening: red band, chest pulls, overhead pulls, er pulls, PNF up, PNF down, x10  03/21/23 -Myofascial release and manual techniques to right upper arm, anterior shoulder, trapezius, and scapular regions to decrease pain and fascial restrictions and improve joint ROM -AA/ROM: supine-protraction, flexion, er/IR, abduction, horizontal  abduction, x12 -A/ROM: supine, flexion, abduction, protraction, horizontal abduction, er/IR, x10 -functional reaching: 1#, shelves 1 and 2 x10 each -Scapular Strengthening: extension, retraction, rows, x10 -UBE: level 1, 2.5' forwards and backwards, 3.0+ kph    PATIENT EDUCATION: Education details: reviewed HEP Person educated: Patient Education method: Explanation, Demonstration, and Handouts Education comprehension: verbalized understanding and returned demonstration  HOME EXERCISE PROGRAM: Eval: scapular A/ROM, table slides 1/9: AA/ROM 1/23: A/ROM 1/27: Scapular Strengthening  GOALS: Goals reviewed with patient? Yes  SHORT TERM GOALS: Target date: 03/22/23  Pt will be provided with and educated on HEP to improve mobility in RUE required for use during ADL completion.   Goal status: IN PROGRESS  2.  Pt will increase RUE P/ROM by 10+ degrees to improve ability to use RUE during dressing tasks with minimal compensatory techniques.   Goal status: IN PROGRESS    LONG TERM GOALS: Target date: 04/12/23  Pt will decrease pain in RUE to 3/10 or less to improve ability to sleep for 2+ consecutive hours without waking due to pain.   Goal status: IN PROGRESS  2.  Pt will decrease RUE fascial restrictions to min amounts or less to improve mobility required for functional reaching tasks.   Goal  status: IN PROGRESS  3.  Pt will increase RUE A/ROM by 35+ degrees to improve ability to use RUE when reaching overhead or behind back during dressing and bathing tasks.   Goal status: IN PROGRESS  4.  Pt will increase RUE strength to 4/5 or greater to improve ability to use RUE when lifting or carrying items during meal preparation/housework/yardwork tasks.   Goal status: IN PROGRESS  ASSESSMENT:  CLINICAL IMPRESSION: Pt reports she is completing HEP intermittently, reaches into overhead cabinets frequently during the day when cooking/baking. Pt with mod fascial restrictions at  anterior deltoid and bicep regions, manual techniques completed to address. Pt completing A/ROM in supine, added shoulder stretches in standing and continued functional reaching. Added ball pass for IR behind back, pt is currently able to reach lateral edge of back pocket. Also added overhead lacing, mod fatigue during tasks. Verbal cuing for form and technique throughout session.   PERFORMANCE DEFICITS: in functional skills including ADLs, IADLs, ROM, strength, pain, fascial restrictions, muscle spasms, and UE functional use     PLAN:  OT FREQUENCY: 2x/week  OT DURATION: 6 weeks  PLANNED INTERVENTIONS: 97168 OT Re-evaluation, 97535 self care/ADL training, 78469 therapeutic exercise, 97530 therapeutic activity, 97140 manual therapy, 97035 ultrasound, 97014 electrical stimulation unattended, patient/family education, and DME and/or AE instructions  CONSULTED AND AGREED WITH PLAN OF CARE: Patient  PLAN FOR NEXT SESSION: Continue with A/ROM, proximal shoulder strengthening, functional reaching, scapular theraband   Ezra Sites, OTR/L  [phone] 03/28/2023, 12:29 PM

## 2023-04-01 ENCOUNTER — Encounter (HOSPITAL_COMMUNITY): Payer: Self-pay | Admitting: Occupational Therapy

## 2023-04-01 ENCOUNTER — Ambulatory Visit (HOSPITAL_COMMUNITY): Payer: No Typology Code available for payment source | Attending: Orthopedic Surgery | Admitting: Occupational Therapy

## 2023-04-01 DIAGNOSIS — M25511 Pain in right shoulder: Secondary | ICD-10-CM | POA: Diagnosis present

## 2023-04-01 DIAGNOSIS — R29898 Other symptoms and signs involving the musculoskeletal system: Secondary | ICD-10-CM | POA: Insufficient documentation

## 2023-04-01 DIAGNOSIS — M25611 Stiffness of right shoulder, not elsewhere classified: Secondary | ICD-10-CM | POA: Diagnosis present

## 2023-04-01 NOTE — Therapy (Signed)
OUTPATIENT OCCUPATIONAL THERAPY ORTHO TREATMENT  Patient Name: Sharon [name] MRN: [id number] DOB:1945/12/15, 78 y.o., female Today's Date: 04/01/2023    END OF SESSION:  OT End of Session - 04/01/23 1055     Visit Number 9    Number of Visits 12    Date for OT Re-Evaluation 04/12/23    Authorization Type [employer], $25 copay    Progress Note Due on Visit 10    OT Start Time 1014    OT Stop Time 1055    OT Time Calculation (min) 41 min    Activity Tolerance Patient tolerated treatment well    Behavior During Therapy WFL for tasks assessed/performed                Past Medical History:  Diagnosis Date   Arthritis    lower back   Dental crowns present    caps on top front teeth   Depression    Dyspnea    GERD (gastroesophageal reflux disease)    Hemorrhoids    Kidney stones    Past Surgical History:  Procedure Laterality Date   ABDOMINAL HYSTERECTOMY     ARTHOSCOPIC ROTAOR CUFF REPAIR     BREAST SURGERY     COLONOSCOPY WITH PROPOFOL N/A 09/26/2015   Procedure: COLONOSCOPY WITH PROPOFOL;  Surgeon: Midge Minium, MD;  Location: [facility];  Service: Endoscopy;  Laterality: N/A;   COLONOSCOPY WITH PROPOFOL N/A 09/25/2021   Procedure: COLONOSCOPY WITH BIOPSIES;  Surgeon: Midge Minium, MD;  Location: [facility];  Service: Endoscopy;  Laterality: N/A;   LEG SURGERY     POLYPECTOMY  09/26/2015   Procedure: POLYPECTOMY;  Surgeon: Midge Minium, MD;  Location: [facility];  Service: Endoscopy;;   POLYPECTOMY N/A 09/25/2021   Procedure: POLYPECTOMY;  Surgeon: Midge Minium, MD;  Location: [facility];  Service: Endoscopy;  Laterality: N/A;   Patient Active Problem List   Diagnosis Date Noted   History of colonic polyps    Special screening for malignant neoplasms, colon    Benign neoplasm of ascending colon    Benign neoplasm of sigmoid colon    Mixed incontinence 03/16/2014   Calculus of kidney 03/16/2014   Renal colic 03/16/2014    Pain in shoulder 11/23/2013   Colon polyp 11/10/2013   Cough 11/10/2013   Clinical depression 11/10/2013   Bloodgood disease 11/10/2013   PCP: Riley Kill, PA-C REFERRING PROVIDER: Dr. Carin Primrose  ONSET DATE: 02/06/23  REFERRING DIAG: s/p right TSA  THERAPY DIAG:  Acute pain of right shoulder  Stiffness of right shoulder, not elsewhere classified  Other symptoms and signs involving the musculoskeletal system  Rationale for Evaluation and Treatment: Rehabilitation  SUBJECTIVE:   SUBJECTIVE STATEMENT: S: "I slept with a heating pad on it last night."   PERTINENT HISTORY: Pt is a 78 y/o female s/p right TSA on 02/06/23. Pt is not currently wearing sling. Did not have any HH therapy due to company scheduling. Pt with hx of RCR and bicep tendon repair 10+ years ago.   PRECAUTIONS: Shoulder   WEIGHT BEARING RESTRICTIONS: Yes NWB  PAIN:  Are you having pain? No  FALLS: Has patient fallen in last 6 months? No  PLOF: Independent  PATIENT GOALS: To improve reaching ability.   NEXT MD VISIT: approximately 6 weeks  OBJECTIVE:  Note: Objective measures were completed at Evaluation unless otherwise noted.  HAND DOMINANCE: Right  ADLs: Pt reports difficulty with reaching behind back and into cabinets. Pt has difficulty  lifting items.    FUNCTIONAL OUTCOME MEASURES: FOTO: 58/100  UPPER EXTREMITY ROM:       Assessed seated, er/IR adducted  Active ROM Right eval  Shoulder flexion 112  Shoulder abduction 92  Shoulder internal rotation 90  Shoulder external rotation 27  (Blank rows = not tested)  Assessed supine, er/IR adducted  Passive ROM Right eval  Shoulder flexion 140  Shoulder abduction 130  Shoulder internal rotation 90  Shoulder external rotation 25  (Blank rows = not tested)  UPPER EXTREMITY MMT:       Assessed on observation, formal MMT not completed due to protocol/precautions  MMT Right eval  Shoulder flexion 3/5  Shoulder abduction  3/5  Shoulder internal rotation 3/5  Shoulder external rotation 3-/5  (Blank rows = not tested)  SENSATION: Occasional numbness around incision  OBSERVATIONS: Mod to max fascial restrictions along upper arm and anterior shoulder, mod restrictions in trapezius and scapular regions   TREATMENT DATE:   04/01/23 -Myofascial release and manual techniques to right upper arm, anterior shoulder, trapezius, and scapular regions to decrease pain and fascial restrictions and improve joint ROM -A/ROM: supine-flexion, abduction, protraction, horizontal abduction, er/IR, x15 -Shoulder stretches: flexion, doorway stretch, IR behind back with horizontal towel, 2x10" holds -Ball pass behind back for IR, 10 reps -Scapular Strengthening: red band, extension, retraction, rows, x15 -UBE: level 2, 2.5' forwards and backwards  03/28/23 -Myofascial release and manual techniques to right upper arm, anterior shoulder, trapezius, and scapular regions to decrease pain and fascial restrictions and improve joint ROM -P/ROM: supine-flexion, abduction, er, horizontal abduction, 5 reps -A/ROM: supine-flexion, abduction, protraction, horizontal abduction, er/IR, 10 reps -Proximal Shoulder Strengthening: supine-paddles, criss cross, circles both directions., 10 reps each -Shoulder stretches: flexion, doorway stretch, IR behind back with horizontal towel, 2x10" holds -Ball pass behind back for IR, 10 reps -Functional reaching: pt placing 10 cones on top shelf of overhead cabinet in flexion, removing in abduction -Overhead lacing: seated-lacing from top down then reversing -UBE: Level 1, 3' forward, 3' reverse, pac: 5.0  03/25/23 -Myofascial release and manual techniques to right upper arm, anterior shoulder, trapezius, and scapular regions to decrease pain and fascial restrictions and improve joint ROM -AA/ROM: supine-protraction, flexion, er/IR, abduction, horizontal abduction, x12 -A/ROM: seated, flexion, abduction,  protraction, horizontal abduction, er/IR, x12 -X to V arms, x10 -Goal Post arms, x10 -Proximal Shoulder Strengthening: paddles, criss cross, circles both directions., x10 -PNF Strengthening: red band, chest pulls, overhead pulls, er pulls, PNF up, PNF down, x10    PATIENT EDUCATION: Education details: Shoulder stretches Person educated: Patient Education method: Explanation, Demonstration, and Handouts Education comprehension: verbalized understanding and returned demonstration  HOME EXERCISE PROGRAM: Eval: scapular A/ROM, table slides 1/9: AA/ROM 1/23: A/ROM 1/27: Scapular Strengthening 2/3: Shoulder stretches  GOALS: Goals reviewed with patient? Yes  SHORT TERM GOALS: Target date: 03/22/23  Pt will be provided with and educated on HEP to improve mobility in RUE required for use during ADL completion.   Goal status: IN PROGRESS  2.  Pt will increase RUE P/ROM by 10+ degrees to improve ability to use RUE during dressing tasks with minimal compensatory techniques.   Goal status: IN PROGRESS    LONG TERM GOALS: Target date: 04/12/23  Pt will decrease pain in RUE to 3/10 or less to improve ability to sleep for 2+ consecutive hours without waking due to pain.   Goal status: IN PROGRESS  2.  Pt will decrease RUE fascial restrictions to min amounts or less to improve mobility  required for functional reaching tasks.   Goal status: IN PROGRESS  3.  Pt will increase RUE A/ROM by 35+ degrees to improve ability to use RUE when reaching overhead or behind back during dressing and bathing tasks.   Goal status: IN PROGRESS  4.  Pt will increase RUE strength to 4/5 or greater to improve ability to use RUE when lifting or carrying items during meal preparation/housework/yardwork tasks.   Goal status: IN PROGRESS  ASSESSMENT:  CLINICAL IMPRESSION: This session, pt continuing to work on shoulder mobility and strength. She continues to have increased fatigue and reports weakness,  during A/ROM, requiring rest breaks. OT  continuing shoulder stretches and adding them to HEP for continued mobility work. Additionally, she was able to tolerate increasing scapular strengthening repetition for continued strengthening. Verbal and tactile cuing provided for positioning and technique throughout session, as well as education to complete HEP consistently for improved mobility and strength.   PERFORMANCE DEFICITS: in functional skills including ADLs, IADLs, ROM, strength, pain, fascial restrictions, muscle spasms, and UE functional use     PLAN:  OT FREQUENCY: 2x/week  OT DURATION: 6 weeks  PLANNED INTERVENTIONS: 97168 OT Re-evaluation, 97535 self care/ADL training, 16109 therapeutic exercise, 97530 therapeutic activity, 97140 manual therapy, 97035 ultrasound, 97014 electrical stimulation unattended, patient/family education, and DME and/or AE instructions  CONSULTED AND AGREED WITH PLAN OF CARE: Patient  PLAN FOR NEXT SESSION: Continue with A/ROM, proximal shoulder strengthening, functional reaching, scapular Cathren Harsh, OTR/L [phone] 04/01/2023, 10:56 AM

## 2023-04-04 ENCOUNTER — Encounter (HOSPITAL_COMMUNITY): Payer: Medicare HMO | Admitting: Occupational Therapy

## 2023-04-05 ENCOUNTER — Encounter (HOSPITAL_COMMUNITY): Payer: Self-pay | Admitting: Occupational Therapy

## 2023-04-05 ENCOUNTER — Ambulatory Visit (HOSPITAL_COMMUNITY): Payer: No Typology Code available for payment source | Admitting: Occupational Therapy

## 2023-04-05 DIAGNOSIS — M25511 Pain in right shoulder: Secondary | ICD-10-CM | POA: Diagnosis not present

## 2023-04-05 DIAGNOSIS — R29898 Other symptoms and signs involving the musculoskeletal system: Secondary | ICD-10-CM

## 2023-04-05 DIAGNOSIS — M25611 Stiffness of right shoulder, not elsewhere classified: Secondary | ICD-10-CM

## 2023-04-05 NOTE — Patient Instructions (Signed)

## 2023-04-05 NOTE — Therapy (Signed)
 OUTPATIENT OCCUPATIONAL THERAPY ORTHO TREATMENT  Patient Name: Sharon [name] MRN: [id number] DOB:1945/07/10, 78 y.o., female Today's Date: 04/05/2023    END OF SESSION:  OT End of Session - 04/05/23 1558     Visit Number 10    Number of Visits 12    Date for OT Re-Evaluation 04/12/23    Authorization Type [employer], $25 copay    Progress Note Due on Visit 10    OT Start Time 1515    OT Stop Time 1558    OT Time Calculation (min) 43 min    Activity Tolerance Patient tolerated treatment well    Behavior During Therapy WFL for tasks assessed/performed                 Past Medical History:  Diagnosis Date   Arthritis    lower back   Dental crowns present    caps on top front teeth   Depression    Dyspnea    GERD (gastroesophageal reflux disease)    Hemorrhoids    Kidney stones    Past Surgical History:  Procedure Laterality Date   ABDOMINAL HYSTERECTOMY     ARTHOSCOPIC ROTAOR CUFF REPAIR     BREAST SURGERY     COLONOSCOPY WITH PROPOFOL  N/A 09/26/2015   Procedure: COLONOSCOPY WITH PROPOFOL ;  Surgeon: Rogelia Copping, MD;  Location: [facility];  Service: Endoscopy;  Laterality: N/A;   COLONOSCOPY WITH PROPOFOL  N/A 09/25/2021   Procedure: COLONOSCOPY WITH BIOPSIES;  Surgeon: Copping Rogelia, MD;  Location: [facility];  Service: Endoscopy;  Laterality: N/A;   LEG SURGERY     POLYPECTOMY  09/26/2015   Procedure: POLYPECTOMY;  Surgeon: Rogelia Copping, MD;  Location: [facility];  Service: Endoscopy;;   POLYPECTOMY N/A 09/25/2021   Procedure: POLYPECTOMY;  Surgeon: Copping Rogelia, MD;  Location: [facility];  Service: Endoscopy;  Laterality: N/A;   Patient Active Problem List   Diagnosis Date Noted   History of colonic polyps    Special screening for malignant neoplasms, colon    Benign neoplasm of ascending colon    Benign neoplasm of sigmoid colon    Mixed incontinence 03/16/2014   Calculus of kidney 03/16/2014   Renal colic 03/16/2014    Pain in shoulder 11/23/2013   Colon polyp 11/10/2013   Cough 11/10/2013   Clinical depression 11/10/2013   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 Alease Oyster  ONSET DATE: 02/06/23  REFERRING DIAG: s/p right TSA  THERAPY DIAG:  Acute pain of right shoulder  Stiffness of right shoulder, not elsewhere classified  Other symptoms and signs involving the musculoskeletal system  Rationale for Evaluation and Treatment: Rehabilitation  SUBJECTIVE:   SUBJECTIVE STATEMENT: S: I slept with a heating pad on it last night.   PERTINENT HISTORY: Pt is a 78 y/o female s/p right TSA on 02/06/23. Pt is not currently wearing sling. Did not have any HH therapy due to company scheduling. Pt with hx of RCR and bicep tendon repair 10+ years ago.   PRECAUTIONS: Shoulder   WEIGHT BEARING RESTRICTIONS: Yes NWB  PAIN:  Are you having pain? No  FALLS: Has patient fallen in last 6 months? No  PLOF: Independent  PATIENT GOALS: To improve reaching ability.   NEXT MD VISIT: approximately 6 weeks  OBJECTIVE:  Note: Objective measures were completed at Evaluation unless otherwise noted.  HAND DOMINANCE: Right  ADLs: Pt reports difficulty with reaching behind back and into cabinets. Pt has  difficulty lifting items.    FUNCTIONAL OUTCOME MEASURES: FOTO: 58/100  UPPER EXTREMITY ROM:       Assessed seated, er/IR adducted  Active ROM Right eval  Shoulder flexion 112  Shoulder abduction 92  Shoulder internal rotation 90  Shoulder external rotation 27  (Blank rows = not tested)  Assessed supine, er/IR adducted  Passive ROM Right eval  Shoulder flexion 140  Shoulder abduction 130  Shoulder internal rotation 90  Shoulder external rotation 25  (Blank rows = not tested)  UPPER EXTREMITY MMT:       Assessed on observation, formal MMT not completed due to protocol/precautions  MMT Right eval  Shoulder flexion 3/5  Shoulder abduction  3/5  Shoulder internal rotation 3/5  Shoulder external rotation 3-/5  (Blank rows = not tested)  SENSATION: Occasional numbness around incision  OBSERVATIONS: Mod to max fascial restrictions along upper arm and anterior shoulder, mod restrictions in trapezius and scapular regions   TREATMENT DATE:   04/05/23 -Myofascial release and manual techniques to right upper arm, anterior shoulder, trapezius, and scapular regions to decrease pain and fascial restrictions and improve joint ROM -A/ROM: supine-flexion, abduction, protraction, horizontal abduction, er/IR, x15 -X to V arms, x12 -Goal Post arms, x12 -Shoulder stretches: flexion, doorway stretch, IR behind back with horizontal towel, 2x10 holds -Scapular Strengthening: red band, extension, retraction, rows, x15 -Shoulder Strengthening: red band, horizontal abduction, yellow band, er, IR,   04/01/23 -Myofascial release and manual techniques to right upper arm, anterior shoulder, trapezius, and scapular regions to decrease pain and fascial restrictions and improve joint ROM -A/ROM: supine-flexion, abduction, protraction, horizontal abduction, er/IR, x15 -Shoulder stretches: flexion, doorway stretch, IR behind back with horizontal towel, 2x10 holds -Ball pass behind back for IR, 10 reps -Scapular Strengthening: red band, extension, retraction, rows, x15 -UBE: level 2, 2.5' forwards and backwards  03/28/23 -Myofascial release and manual techniques to right upper arm, anterior shoulder, trapezius, and scapular regions to decrease pain and fascial restrictions and improve joint ROM -P/ROM: supine-flexion, abduction, er, horizontal abduction, 5 reps -A/ROM: supine-flexion, abduction, protraction, horizontal abduction, er/IR, 10 reps -Proximal Shoulder Strengthening: supine-paddles, criss cross, circles both directions., 10 reps each -Shoulder stretches: flexion, doorway stretch, IR behind back with horizontal towel, 2x10 holds -Ball pass  behind back for IR, 10 reps -Functional reaching: pt placing 10 cones on top shelf of overhead cabinet in flexion, removing in abduction -Overhead lacing: seated-lacing from top down then reversing -UBE: Level 1, 3' forward, 3' reverse, pac: 5.0   PATIENT EDUCATION: Education details: Shoulder strengthening Person educated: Patient Education method: Explanation, Demonstration, and Handouts Education comprehension: verbalized understanding and returned demonstration  HOME EXERCISE PROGRAM: Eval: scapular A/ROM, table slides 1/9: AA/ROM 1/23: A/ROM 1/27: Scapular Strengthening 2/3: Shoulder stretches 2/7: Shoulder Strengthening  GOALS: Goals reviewed with patient? Yes  SHORT TERM GOALS: Target date: 03/22/23  Pt will be provided with and educated on HEP to improve mobility in RUE required for use during ADL completion.   Goal status: IN PROGRESS  2.  Pt will increase RUE P/ROM by 10+ degrees to improve ability to use RUE during dressing tasks with minimal compensatory techniques.   Goal status: IN PROGRESS    LONG TERM GOALS: Target date: 04/12/23  Pt will decrease pain in RUE to 3/10 or less to improve ability to sleep for 2+ consecutive hours without waking due to pain.   Goal status: IN PROGRESS  2.  Pt will decrease RUE fascial restrictions to min amounts or less to improve  mobility required for functional reaching tasks.   Goal status: IN PROGRESS  3.  Pt will increase RUE A/ROM by 35+ degrees to improve ability to use RUE when reaching overhead or behind back during dressing and bathing tasks.   Goal status: IN PROGRESS  4.  Pt will increase RUE strength to 4/5 or greater to improve ability to use RUE when lifting or carrying items during meal preparation/housework/yardwork tasks.   Goal status: IN PROGRESS  ASSESSMENT:  CLINICAL IMPRESSION: This session, pt continuing to work on shoulder mobility and strength. She continues to have increased fatigue and  weakness/tightness. OT added shoulder strengthening this session, which pt had moderate difficulty due to weakness, especially with abduction, flexion, and er. Pt and OT also reviewed stretches, as she continues to report increased tightness with all movements and exercises. Verbal and visual cuing provided for positioning and technique throughout session.   PERFORMANCE DEFICITS: in functional skills including ADLs, IADLs, ROM, strength, pain, fascial restrictions, muscle spasms, and UE functional use     PLAN:  OT FREQUENCY: 2x/week  OT DURATION: 6 weeks  PLANNED INTERVENTIONS: 97168 OT Re-evaluation, 97535 self care/ADL training, 02889 therapeutic exercise, 97530 therapeutic activity, 97140 manual therapy, 97035 ultrasound, 97014 electrical stimulation unattended, patient/family education, and DME and/or AE instructions  CONSULTED AND AGREED WITH PLAN OF CARE: Patient  PLAN FOR NEXT SESSION: Continue with A/ROM, proximal shoulder strengthening, functional reaching, scapular fleurette Valentin Nightingale, OTR/L [phone] 04/05/2023, 4:11 PM

## 2023-04-08 ENCOUNTER — Ambulatory Visit (HOSPITAL_COMMUNITY): Payer: No Typology Code available for payment source | Admitting: Occupational Therapy

## 2023-04-08 ENCOUNTER — Encounter (HOSPITAL_COMMUNITY): Payer: Self-pay | Admitting: Occupational Therapy

## 2023-04-08 DIAGNOSIS — M25511 Pain in right shoulder: Secondary | ICD-10-CM

## 2023-04-08 DIAGNOSIS — M25611 Stiffness of right shoulder, not elsewhere classified: Secondary | ICD-10-CM

## 2023-04-08 DIAGNOSIS — R29898 Other symptoms and signs involving the musculoskeletal system: Secondary | ICD-10-CM

## 2023-04-08 NOTE — Therapy (Signed)
 OUTPATIENT OCCUPATIONAL THERAPY ORTHO TREATMENT  Patient Name: Sharon [name] MRN: [id number] DOB:1945/11/12, 78 y.o., female Today's Date: 04/08/2023    END OF SESSION:  OT End of Session - 04/08/23 1106     Visit Number 11    Number of Visits 12    Date for OT Re-Evaluation 04/12/23    Authorization Type [employer], $25 copay    Progress Note Due on Visit 10    OT Start Time 1019    OT Stop Time 1100    OT Time Calculation (min) 41 min    Activity Tolerance Patient tolerated treatment well    Behavior During Therapy WFL for tasks assessed/performed             Past Medical History:  Diagnosis Date   Arthritis    lower back   Dental crowns present    caps on top front teeth   Depression    Dyspnea    GERD (gastroesophageal reflux disease)    Hemorrhoids    Kidney stones    Past Surgical History:  Procedure Laterality Date   ABDOMINAL HYSTERECTOMY     ARTHOSCOPIC ROTAOR CUFF REPAIR     BREAST SURGERY     COLONOSCOPY WITH PROPOFOL  N/A 09/26/2015   Procedure: COLONOSCOPY WITH PROPOFOL ;  Surgeon: Marnee Sink, MD;  Location: [facility];  Service: Endoscopy;  Laterality: N/A;   COLONOSCOPY WITH PROPOFOL  N/A 09/25/2021   Procedure: COLONOSCOPY WITH BIOPSIES;  Surgeon: Marnee Sink, MD;  Location: [facility];  Service: Endoscopy;  Laterality: N/A;   LEG SURGERY     POLYPECTOMY  09/26/2015   Procedure: POLYPECTOMY;  Surgeon: Marnee Sink, MD;  Location: [facility];  Service: Endoscopy;;   POLYPECTOMY N/A 09/25/2021   Procedure: POLYPECTOMY;  Surgeon: Marnee Sink, MD;  Location: [facility];  Service: Endoscopy;  Laterality: N/A;   Patient Active Problem List   Diagnosis Date Noted   History of colonic polyps    Special screening for malignant neoplasms, colon    Benign neoplasm of ascending colon    Benign neoplasm of sigmoid colon    Mixed incontinence 03/16/2014   Calculus of kidney 03/16/2014   Renal colic 03/16/2014    Pain in shoulder 11/23/2013   Colon polyp 11/10/2013   Cough 11/10/2013   Clinical depression 11/10/2013   Bloodgood disease 11/10/2013   PCP: Melodee Spruce, PA-C REFERRING PROVIDER: Dr. Booker Buys  ONSET DATE: 02/06/23  REFERRING DIAG: s/p right TSA  THERAPY DIAG:  Acute pain of right shoulder  Stiffness of right shoulder, not elsewhere classified  Other symptoms and signs involving the musculoskeletal system  Rationale for Evaluation and Treatment: Rehabilitation  SUBJECTIVE:   SUBJECTIVE STATEMENT: S: "I slept with a heating pad on it last night."   PERTINENT HISTORY: Pt is a 78 y/o female s/p right TSA on 02/06/23. Pt is not currently wearing sling. Did not have any HH therapy due to company scheduling. Pt with hx of RCR and bicep tendon repair 10+ years ago.   PRECAUTIONS: Shoulder   WEIGHT BEARING RESTRICTIONS: Yes NWB  PAIN:  Are you having pain? No  FALLS: Has patient fallen in last 6 months? No  PLOF: Independent  PATIENT GOALS: To improve reaching ability.   NEXT MD VISIT: approximately 6 weeks  OBJECTIVE:  Note: Objective measures were completed at Evaluation unless otherwise noted.  HAND DOMINANCE: Right  ADLs: Pt reports difficulty with reaching behind back and into cabinets. Pt has difficulty lifting items.  FUNCTIONAL OUTCOME MEASURES: FOTO: 58/100  UPPER EXTREMITY ROM:       Assessed seated, er/IR adducted  Active ROM Right eval  Shoulder flexion 112  Shoulder abduction 92  Shoulder internal rotation 90  Shoulder external rotation 27  (Blank rows = not tested)  Assessed supine, er/IR adducted  Passive ROM Right eval  Shoulder flexion 140  Shoulder abduction 130  Shoulder internal rotation 90  Shoulder external rotation 25  (Blank rows = not tested)  UPPER EXTREMITY MMT:       Assessed on observation, formal MMT not completed due to protocol/precautions  MMT Right eval  Shoulder flexion 3/5  Shoulder abduction  3/5  Shoulder internal rotation 3/5  Shoulder external rotation 3-/5  (Blank rows = not tested)  SENSATION: Occasional numbness around incision  OBSERVATIONS: Mod to max fascial restrictions along upper arm and anterior shoulder, mod restrictions in trapezius and scapular regions   TREATMENT DATE:   04/08/23 -Pulleys: flexion, abduction, x60" -A/ROM: supine-flexion, abduction, protraction, horizontal abduction, er/IR, x15 -X to V arms, x15 -Goal Post arms, x10 -PNF Strengthening: green band, chest pulls, overhead pulls, er pulls, PNF up, PNF down, x10 -[facility] on the wall: flexion, abduction, x10 -Functional Reaching: 1# to 1st and 2nd shelf in flexion, 2# to 1st and 2nd shelf, x10 each -UBE: level 2, 3' forwards and backwards  04/05/23 -Myofascial release and manual techniques to right upper arm, anterior shoulder, trapezius, and scapular regions to decrease pain and fascial restrictions and improve joint ROM -A/ROM: supine-flexion, abduction, protraction, horizontal abduction, er/IR, x15 -X to V arms, x12 -Goal Post arms, x12 -Shoulder stretches: flexion, doorway stretch, IR behind back with horizontal towel, 2x10" holds -Scapular Strengthening: red band, extension, retraction, rows, x15 -Shoulder Strengthening: red band, horizontal abduction, yellow band, er, IR,   04/01/23 -Myofascial release and manual techniques to right upper arm, anterior shoulder, trapezius, and scapular regions to decrease pain and fascial restrictions and improve joint ROM -A/ROM: supine-flexion, abduction, protraction, horizontal abduction, er/IR, x15 -Shoulder stretches: flexion, doorway stretch, IR behind back with horizontal towel, 2x10" holds -Ball pass behind back for IR, 10 reps -Scapular Strengthening: red band, extension, retraction, rows, x15 -UBE: level 2, 2.5' forwards and backwards   PATIENT EDUCATION: Education details: Continue HEP Person educated: Patient Education method:  Explanation, Demonstration, and Handouts Education comprehension: verbalized understanding and returned demonstration  HOME EXERCISE PROGRAM: Eval: scapular A/ROM, table slides 1/9: AA/ROM 1/23: A/ROM 1/27: Scapular Strengthening 2/3: Shoulder stretches 2/7: Shoulder Strengthening  GOALS: Goals reviewed with patient? Yes  SHORT TERM GOALS: Target date: 03/22/23  Pt will be provided with and educated on HEP to improve mobility in RUE required for use during ADL completion.   Goal status: IN PROGRESS  2.  Pt will increase RUE P/ROM by 10+ degrees to improve ability to use RUE during dressing tasks with minimal compensatory techniques.   Goal status: IN PROGRESS    LONG TERM GOALS: Target date: 04/12/23  Pt will decrease pain in RUE to 3/10 or less to improve ability to sleep for 2+ consecutive hours without waking due to pain.   Goal status: IN PROGRESS  2.  Pt will decrease RUE fascial restrictions to min amounts or less to improve mobility required for functional reaching tasks.   Goal status: IN PROGRESS  3.  Pt will increase RUE A/ROM by 35+ degrees to improve ability to use RUE when reaching overhead or behind back during dressing and bathing tasks.   Goal status: IN PROGRESS  4.  Pt will increase RUE strength to 4/5 or greater to improve ability to use RUE when lifting or carrying items during meal preparation/housework/yardwork tasks.   Goal status: IN PROGRESS  ASSESSMENT:  CLINICAL IMPRESSION: This session, pt continuing to have significant shoulder tightness, leading to discomfort and decreased ROM and tolerance. She required multiple rest breaks, where OT cued her to let her arm dangle and relax during her rest breaks. She continues to have difficulty with resistance based exercises due to the tightness in her, especially with abduction and external rotation. Verbal and tactile cuing provided for positioning and technique throughout session.   PERFORMANCE  DEFICITS: in functional skills including ADLs, IADLs, ROM, strength, pain, fascial restrictions, muscle spasms, and UE functional use     PLAN:  OT FREQUENCY: 2x/week  OT DURATION: 6 weeks  PLANNED INTERVENTIONS: 97168 OT Re-evaluation, 97535 self care/ADL training, 81191 therapeutic exercise, 97530 therapeutic activity, 97140 manual therapy, 97035 ultrasound, 97014 electrical stimulation unattended, patient/family education, and DME and/or AE instructions  CONSULTED AND AGREED WITH PLAN OF CARE: Patient  PLAN FOR NEXT SESSION: Continue with A/ROM, proximal shoulder strengthening, functional reaching, scapular Authur Boards, OTR/L [phone] 04/08/2023, 11:08 AM

## 2023-04-11 ENCOUNTER — Ambulatory Visit (HOSPITAL_COMMUNITY): Payer: No Typology Code available for payment source | Admitting: Occupational Therapy

## 2023-04-11 ENCOUNTER — Encounter (HOSPITAL_COMMUNITY): Payer: Self-pay | Admitting: Occupational Therapy

## 2023-04-11 DIAGNOSIS — M25611 Stiffness of right shoulder, not elsewhere classified: Secondary | ICD-10-CM

## 2023-04-11 DIAGNOSIS — M25511 Pain in right shoulder: Secondary | ICD-10-CM

## 2023-04-11 DIAGNOSIS — R29898 Other symptoms and signs involving the musculoskeletal system: Secondary | ICD-10-CM

## 2023-04-11 NOTE — Therapy (Signed)
OUTPATIENT OCCUPATIONAL THERAPY ORTHO TREATMENT REASSESSMENT & RECERTIFICATION  Patient Name: Sharon [name] MRN: [id number] DOB:10-23-1945, 78 y.o., female Today's Date: 04/11/2023    END OF SESSION:  OT End of Session - 04/11/23 1104     Visit Number 12    Number of Visits 16    Date for OT Re-Evaluation 05/11/23    Authorization Type [employer], $25 copay    Progress Note Due on Visit --    OT Start Time 1014    OT Stop Time 1055    OT Time Calculation (min) 41 min    Activity Tolerance Patient tolerated treatment well    Behavior During Therapy WFL for tasks assessed/performed              Past Medical History:  Diagnosis Date   Arthritis    lower back   Dental crowns present    caps on top front teeth   Depression    Dyspnea    GERD (gastroesophageal reflux disease)    Hemorrhoids    Kidney stones    Past Surgical History:  Procedure Laterality Date   ABDOMINAL HYSTERECTOMY     ARTHOSCOPIC ROTAOR CUFF REPAIR     BREAST SURGERY     COLONOSCOPY WITH PROPOFOL N/A 09/26/2015   Procedure: COLONOSCOPY WITH PROPOFOL;  Surgeon: Midge Minium, MD;  Location: [facility];  Service: Endoscopy;  Laterality: N/A;   COLONOSCOPY WITH PROPOFOL N/A 09/25/2021   Procedure: COLONOSCOPY WITH BIOPSIES;  Surgeon: Midge Minium, MD;  Location: [facility];  Service: Endoscopy;  Laterality: N/A;   LEG SURGERY     POLYPECTOMY  09/26/2015   Procedure: POLYPECTOMY;  Surgeon: Midge Minium, MD;  Location: [facility];  Service: Endoscopy;;   POLYPECTOMY N/A 09/25/2021   Procedure: POLYPECTOMY;  Surgeon: Midge Minium, MD;  Location: [facility];  Service: Endoscopy;  Laterality: N/A;   Patient Active Problem List   Diagnosis Date Noted   History of colonic polyps    Special screening for malignant neoplasms, colon    Benign neoplasm of ascending colon    Benign neoplasm of sigmoid colon    Mixed incontinence 03/16/2014   Calculus of kidney  03/16/2014   Renal colic 03/16/2014   Pain in shoulder 11/23/2013   Colon polyp 11/10/2013   Cough 11/10/2013   Clinical depression 11/10/2013   Bloodgood disease 11/10/2013   PCP: Riley Kill, PA-C REFERRING PROVIDER: Dr. Carin Primrose  ONSET DATE: 02/06/23  REFERRING DIAG: s/p right TSA  THERAPY DIAG:  Acute pain of right shoulder  Stiffness of right shoulder, not elsewhere classified  Other symptoms and signs involving the musculoskeletal system  Rationale for Evaluation and Treatment: Rehabilitation  SUBJECTIVE:   SUBJECTIVE STATEMENT: S: "I have a knot in my upper arm."  PERTINENT HISTORY: Pt is a 78 y/o female s/p right TSA on 02/06/23. Pt is not currently wearing sling. Did not have any HH therapy due to company scheduling. Pt with hx of RCR and bicep tendon repair 10+ years ago.   PRECAUTIONS: Shoulder   WEIGHT BEARING RESTRICTIONS: Yes NWB  PAIN:  Are you having pain? No  FALLS: Has patient fallen in last 6 months? No  PLOF: Independent  PATIENT GOALS: To improve reaching ability.   NEXT MD VISIT: approximately 6 weeks  OBJECTIVE:  Note: Objective measures were completed at Evaluation unless otherwise noted.  HAND DOMINANCE: Right  ADLs: Pt reports difficulty with reaching behind back and into cabinets. Pt has difficulty lifting items.  FUNCTIONAL OUTCOME MEASURES: FOTO: 58/100  UPPER EXTREMITY ROM:       Assessed seated, er/IR adducted  Active ROM Right eval Right 04/11/23  Shoulder flexion 112 124  Shoulder abduction 92 133  Shoulder internal rotation 90 90  Shoulder external rotation 27 36  (Blank rows = not tested)  Assessed supine, er/IR adducted  Passive ROM Right eval Right 04/11/23  Shoulder flexion 140 152  Shoulder abduction 130 180  Shoulder internal rotation 90 90  Shoulder external rotation 25 45  (Blank rows = not tested)  UPPER EXTREMITY MMT:       Assessed on observation, formal MMT not completed due to  protocol/precautions  MMT Right eval Right 04/11/23  Shoulder flexion 3/5 4/5  Shoulder abduction 3/5 3+/5  Shoulder internal rotation 3/5 4+/5  Shoulder external rotation 3-/5 3/5  (Blank rows = not tested)  SENSATION: Occasional numbness around incision  OBSERVATIONS: Mod to max fascial restrictions along upper arm and anterior shoulder, mod restrictions in trapezius and scapular regions   TREATMENT DATE:  04/11/23 -Myofascial release and manual techniques to right upper arm, anterior shoulder, trapezius, and scapular regions to decrease pain and fascial restrictions and improve joint ROM -P/ROM: supine-flexion, abduction, horizontal abduction, er, 10 reps -A/ROM: sidelying-flexion, abduction, protraction, horizontal abduction, er/IR, 10 reps -Shoulder stretches: flexion, IR behind back with horizontal towel, 2x20" holds -Ball pass behind back from right to left for IR, 10 passes -Ball on wall: 1' flexion, 1' abduction -Overhead lacing: seated, lacing from top down then reversing -Scapular theraband: red-row, extension, retraction, 10 reps  04/08/23 -Pulleys: flexion, abduction, x60" -A/ROM: supine-flexion, abduction, protraction, horizontal abduction, er/IR, x15 -X to V arms, x15 -Goal Post arms, x10 -PNF Strengthening: green band, chest pulls, overhead pulls, er pulls, PNF up, PNF down, x10 -[facility] on the wall: flexion, abduction, x10 -Functional Reaching: 1# to 1st and 2nd shelf in flexion, 2# to 1st and 2nd shelf, x10 each -UBE: level 2, 3' forwards and backwards  04/05/23 -Myofascial release and manual techniques to right upper arm, anterior shoulder, trapezius, and scapular regions to decrease pain and fascial restrictions and improve joint ROM -A/ROM: supine-flexion, abduction, protraction, horizontal abduction, er/IR, x15 -X to V arms, x12 -Goal Post arms, x12 -Shoulder stretches: flexion, doorway stretch, IR behind back with horizontal towel, 2x10"  holds -Scapular Strengthening: red band, extension, retraction, rows, x15 -Shoulder Strengthening: red band, horizontal abduction, yellow band, er, IR,     PATIENT EDUCATION: Education details: Focus on shoulder stretches this week Person educated: Patient Education method: Explanation, Demonstration, and Handouts Education comprehension: verbalized understanding and returned demonstration  HOME EXERCISE PROGRAM: Eval: scapular A/ROM, table slides 1/9: AA/ROM 1/23: A/ROM 1/27: Scapular Strengthening 2/3: Shoulder stretches 2/7: Shoulder Strengthening  GOALS: Goals reviewed with patient? Yes  SHORT TERM GOALS: Target date: 03/22/23  Pt will be provided with and educated on HEP to improve mobility in RUE required for use during ADL completion.   Goal status: IN PROGRESS  2.  Pt will increase RUE P/ROM by 10+ degrees to improve ability to use RUE during dressing tasks with minimal compensatory techniques.   Goal status: MET    LONG TERM GOALS: Target date: 04/12/23  Pt will decrease pain in RUE to 3/10 or less to improve ability to sleep for 2+ consecutive hours without waking due to pain.   Goal status: MET  2.  Pt will decrease RUE fascial restrictions to min amounts or less to improve mobility required for functional reaching tasks.  Goal status: IN PROGRESS  3.  Pt will increase RUE A/ROM by 35+ degrees to improve ability to use RUE when reaching overhead or behind back during dressing and bathing tasks.   Goal status: IN PROGRESS  4.  Pt will increase RUE strength to 4/5 or greater to improve ability to use RUE when lifting or carrying items during meal preparation/housework/yardwork tasks.   Goal status: IN PROGRESS  ASSESSMENT:  CLINICAL IMPRESSION: Reassessment completed this session. Pt is making progress towards goals, demonstrating improved ROM, strength, and functional use of RUE. Pt reports no pain and she is noticing improvement in functional use and  task completion daily. Pt has met 1 STG thus far, reports she is unable to complete HEP daily but completes as she is able. Pt with limitations in strength and A/ROM, however joint mobility and available ROM is good. Pt also noted to have moderate trigger point along medial deltoid and in trapezius today. Discussed progress and recommend continuing skilled OT services for an additional 4 weeks, pt decreasing to 1x/week due to financial reasons. Treatment tasks today focusing on posture and facilitating proper form during tasks. A/ROM progressing to strengthening and adding new ball on wall activity. Verbal, visual, and intermittent tactile curing required for form and technique.   PERFORMANCE DEFICITS: in functional skills including ADLs, IADLs, ROM, strength, pain, fascial restrictions, muscle spasms, and UE functional use     PLAN:  OT FREQUENCY: 1x/week  OT DURATION: 4 weeks  PLANNED INTERVENTIONS: 97168 OT Re-evaluation, 97535 self care/ADL training, 44010 therapeutic exercise, 97530 therapeutic activity, 97140 manual therapy, 97035 ultrasound, 97014 electrical stimulation unattended, patient/family education, and DME and/or AE instructions  CONSULTED AND AGREED WITH PLAN OF CARE: Patient  PLAN FOR NEXT SESSION: Continue with A/ROM, proximal shoulder strengthening, functional reaching, scapular theraband   Ezra Sites, OTR/L  [phone] 04/11/2023, 11:05 AM

## 2023-04-15 ENCOUNTER — Encounter (HOSPITAL_COMMUNITY): Payer: Self-pay | Admitting: Occupational Therapy

## 2023-04-15 ENCOUNTER — Ambulatory Visit (HOSPITAL_COMMUNITY): Payer: No Typology Code available for payment source | Admitting: Occupational Therapy

## 2023-04-15 DIAGNOSIS — M25611 Stiffness of right shoulder, not elsewhere classified: Secondary | ICD-10-CM

## 2023-04-15 DIAGNOSIS — R29898 Other symptoms and signs involving the musculoskeletal system: Secondary | ICD-10-CM

## 2023-04-15 DIAGNOSIS — M25511 Pain in right shoulder: Secondary | ICD-10-CM

## 2023-04-15 NOTE — Therapy (Signed)
 OUTPATIENT OCCUPATIONAL THERAPY ORTHO TREATMENT   Patient Name: Sharon [name] MRN: [id number] DOB:26-Aug-1945, 78 y.o., female Today's Date: 04/15/2023    END OF SESSION:  OT End of Session - 04/15/23 1011     Visit Number 13    Number of Visits 16    Date for OT Re-Evaluation 05/11/23    Authorization Type [employer], $25 copay    OT Start Time [phone]    OT Stop Time 1013    OT Time Calculation (min) 40 min    Activity Tolerance Patient tolerated treatment well    Behavior During Therapy WFL for tasks assessed/performed               Past Medical History:  Diagnosis Date   Arthritis    lower back   Dental crowns present    caps on top front teeth   Depression    Dyspnea    GERD (gastroesophageal reflux disease)    Hemorrhoids    Kidney stones    Past Surgical History:  Procedure Laterality Date   ABDOMINAL HYSTERECTOMY     ARTHOSCOPIC ROTAOR CUFF REPAIR     BREAST SURGERY     COLONOSCOPY WITH PROPOFOL N/A 09/26/2015   Procedure: COLONOSCOPY WITH PROPOFOL;  Surgeon: Midge Minium, MD;  Location: [facility];  Service: Endoscopy;  Laterality: N/A;   COLONOSCOPY WITH PROPOFOL N/A 09/25/2021   Procedure: COLONOSCOPY WITH BIOPSIES;  Surgeon: Midge Minium, MD;  Location: [facility];  Service: Endoscopy;  Laterality: N/A;   LEG SURGERY     POLYPECTOMY  09/26/2015   Procedure: POLYPECTOMY;  Surgeon: Midge Minium, MD;  Location: [facility];  Service: Endoscopy;;   POLYPECTOMY N/A 09/25/2021   Procedure: POLYPECTOMY;  Surgeon: Midge Minium, MD;  Location: [facility];  Service: Endoscopy;  Laterality: N/A;   Patient Active Problem List   Diagnosis Date Noted   History of colonic polyps    Special screening for malignant neoplasms, colon    Benign neoplasm of ascending colon    Benign neoplasm of sigmoid colon    Mixed incontinence 03/16/2014   Calculus of kidney 03/16/2014   Renal colic 03/16/2014   Pain in shoulder 11/23/2013    Colon polyp 11/10/2013   Cough 11/10/2013   Clinical depression 11/10/2013   Bloodgood disease 11/10/2013   PCP: Riley Kill, PA-C REFERRING PROVIDER: Dr. Carin Primrose  ONSET DATE: 02/06/23  REFERRING DIAG: s/p right TSA  THERAPY DIAG:  Acute pain of right shoulder  Stiffness of right shoulder, not elsewhere classified  Other symptoms and signs involving the musculoskeletal system  Rationale for Evaluation and Treatment: Rehabilitation  SUBJECTIVE:   SUBJECTIVE STATEMENT: S: "My first day back went fantastic."   PERTINENT HISTORY: Pt is a 78 y/o female s/p right TSA on 02/06/23. Pt is not currently wearing sling. Did not have any HH therapy due to company scheduling. Pt with hx of RCR and bicep tendon repair 10+ years ago.   PRECAUTIONS: Shoulder   WEIGHT BEARING RESTRICTIONS: Yes NWB  PAIN:  Are you having pain? No  FALLS: Has patient fallen in last 6 months? No  PLOF: Independent  PATIENT GOALS: To improve reaching ability.   NEXT MD VISIT: approximately 6 weeks  OBJECTIVE:  Note: Objective measures were completed at Evaluation unless otherwise noted.  HAND DOMINANCE: Right  ADLs: Pt reports difficulty with reaching behind back and into cabinets. Pt has difficulty lifting items.    FUNCTIONAL OUTCOME MEASURES: FOTO: 58/100  UPPER EXTREMITY  ROM:       Assessed seated, er/IR adducted  Active ROM Right eval Right 04/11/23  Shoulder flexion 112 124  Shoulder abduction 92 133  Shoulder internal rotation 90 90  Shoulder external rotation 27 36  (Blank rows = not tested)  Assessed supine, er/IR adducted  Passive ROM Right eval Right 04/11/23  Shoulder flexion 140 152  Shoulder abduction 130 180  Shoulder internal rotation 90 90  Shoulder external rotation 25 45  (Blank rows = not tested)  UPPER EXTREMITY MMT:       Assessed on observation, formal MMT not completed due to protocol/precautions  MMT Right eval Right 04/11/23  Shoulder  flexion 3/5 4/5  Shoulder abduction 3/5 3+/5  Shoulder internal rotation 3/5 4+/5  Shoulder external rotation 3-/5 3/5  (Blank rows = not tested)  SENSATION: Occasional numbness around incision  OBSERVATIONS: Mod to max fascial restrictions along upper arm and anterior shoulder, mod restrictions in trapezius and scapular regions   TREATMENT DATE:  04/15/23 -Myofascial release and manual techniques to right upper arm, anterior shoulder, trapezius, and scapular regions to decrease pain and fascial restrictions and improve joint ROM -P/ROM: supine-flexion, abduction, horizontal abduction, er, 5 reps -Strengthening: 1#, supine-flexion, abduction, protraction, horizontal abduction, er/IR, 10 reps -Shoulder stretches: flexion, IR behind back with horizontal towel, 2x20" holds -Object pass (using tape roll) behind back from right to left for IR, 10 passes -Theraband strengthening: red-protraction, horizontal abduction, er, IR, 10 reps -Scapular theraband: red-row, extension, retraction, 10 reps -UBE: level 2, 3' forward, 3' reverse, pace: 9.0  04/11/23 -Myofascial release and manual techniques to right upper arm, anterior shoulder, trapezius, and scapular regions to decrease pain and fascial restrictions and improve joint ROM -P/ROM: supine-flexion, abduction, horizontal abduction, er, 10 reps -A/ROM: sidelying-flexion, abduction, protraction, horizontal abduction, er/IR, 10 reps -Shoulder stretches: flexion, IR behind back with horizontal towel, 2x20" holds -Ball pass behind back from right to left for IR, 10 passes -Ball on wall: 1' flexion, 1' abduction -Overhead lacing: seated, lacing from top down then reversing -Scapular theraband: red-row, extension, retraction, 10 reps  04/08/23 -Pulleys: flexion, abduction, x60" -A/ROM: supine-flexion, abduction, protraction, horizontal abduction, er/IR, x15 -X to V arms, x15 -Goal Post arms, x10 -PNF Strengthening: green band, chest pulls,  overhead pulls, er pulls, PNF up, PNF down, x10 -[facility] on the wall: flexion, abduction, x10 -Functional Reaching: 1# to 1st and 2nd shelf in flexion, 2# to 1st and 2nd shelf, x10 each -UBE: level 2, 3' forwards and backwards    PATIENT EDUCATION: Education details: reviewed HEP Person educated: Patient Education method: Explanation, Demonstration, and Handouts Education comprehension: verbalized understanding and returned demonstration  HOME EXERCISE PROGRAM: Eval: scapular A/ROM, table slides 1/9: AA/ROM 1/23: A/ROM 1/27: Scapular Strengthening 2/3: Shoulder stretches 2/7: Shoulder Strengthening  GOALS: Goals reviewed with patient? Yes  SHORT TERM GOALS: Target date: 03/22/23  Pt will be provided with and educated on HEP to improve mobility in RUE required for use during ADL completion.   Goal status: IN PROGRESS  2.  Pt will increase RUE P/ROM by 10+ degrees to improve ability to use RUE during dressing tasks with minimal compensatory techniques.   Goal status: MET    LONG TERM GOALS: Target date: 04/12/23  Pt will decrease pain in RUE to 3/10 or less to improve ability to sleep for 2+ consecutive hours without waking due to pain.   Goal status: MET  2.  Pt will decrease RUE fascial restrictions to min amounts or less to  improve mobility required for functional reaching tasks.   Goal status: IN PROGRESS  3.  Pt will increase RUE A/ROM by 35+ degrees to improve ability to use RUE when reaching overhead or behind back during dressing and bathing tasks.   Goal status: IN PROGRESS  4.  Pt will increase RUE strength to 4/5 or greater to improve ability to use RUE when lifting or carrying items during meal preparation/housework/yardwork tasks.   Goal status: IN PROGRESS  ASSESSMENT:  CLINICAL IMPRESSION: Pt reports her first day back at work was great, no pain in the RUE, however the LUE was hurting and she put heat on it. Continued with manual techniques,  noting improvement in trigger point at medial deltoid. Progressed to strengthening with low level weights and red theraband today. Did not complete flexion or abduction with theraband due to difficulty and form. Continued with shoulder stretches, improvement in passing items behind back into IR. Verbal, visual, and tactile cuing for form and technique.   PERFORMANCE DEFICITS: in functional skills including ADLs, IADLs, ROM, strength, pain, fascial restrictions, muscle spasms, and UE functional use     PLAN:  OT FREQUENCY: 1x/week  OT DURATION: 4 weeks  PLANNED INTERVENTIONS: 97168 OT Re-evaluation, 97535 self care/ADL training, 78295 therapeutic exercise, 97530 therapeutic activity, 97140 manual therapy, 97035 ultrasound, 97014 electrical stimulation unattended, patient/family education, and DME and/or AE instructions  CONSULTED AND AGREED WITH PLAN OF CARE: Patient  PLAN FOR NEXT SESSION: Continue with A/ROM, proximal shoulder strengthening, functional reaching, scapular theraband   Ezra Sites, OTR/L  [phone] 04/15/2023, 10:14 AM

## 2023-04-23 ENCOUNTER — Ambulatory Visit (HOSPITAL_COMMUNITY): Payer: No Typology Code available for payment source | Admitting: Occupational Therapy

## 2023-04-23 ENCOUNTER — Encounter (HOSPITAL_COMMUNITY): Payer: Self-pay | Admitting: Occupational Therapy

## 2023-04-23 DIAGNOSIS — M25511 Pain in right shoulder: Secondary | ICD-10-CM | POA: Diagnosis not present

## 2023-04-23 DIAGNOSIS — M25611 Stiffness of right shoulder, not elsewhere classified: Secondary | ICD-10-CM

## 2023-04-23 DIAGNOSIS — R29898 Other symptoms and signs involving the musculoskeletal system: Secondary | ICD-10-CM

## 2023-04-23 NOTE — Therapy (Signed)
 OUTPATIENT OCCUPATIONAL THERAPY ORTHO TREATMENT   Patient Name: Sharon [name] MRN: [id number] DOB:Apr 06, 1945, 78 y.o., female Today's Date: 04/23/2023    END OF SESSION:  OT End of Session - 04/23/23 1226     Visit Number 14    Number of Visits 16    Date for OT Re-Evaluation 05/11/23    Authorization Type [employer], $25 copay    OT Start Time 1150    OT Stop Time 1229    OT Time Calculation (min) 39 min    Activity Tolerance Patient tolerated treatment well    Behavior During Therapy WFL for tasks assessed/performed                Past Medical History:  Diagnosis Date   Arthritis    lower back   Dental crowns present    caps on top front teeth   Depression    Dyspnea    GERD (gastroesophageal reflux disease)    Hemorrhoids    Kidney stones    Past Surgical History:  Procedure Laterality Date   ABDOMINAL HYSTERECTOMY     ARTHOSCOPIC ROTAOR CUFF REPAIR     BREAST SURGERY     COLONOSCOPY WITH PROPOFOL N/A 09/26/2015   Procedure: COLONOSCOPY WITH PROPOFOL;  Surgeon: Midge Minium, MD;  Location: [facility];  Service: Endoscopy;  Laterality: N/A;   COLONOSCOPY WITH PROPOFOL N/A 09/25/2021   Procedure: COLONOSCOPY WITH BIOPSIES;  Surgeon: Midge Minium, MD;  Location: [facility];  Service: Endoscopy;  Laterality: N/A;   LEG SURGERY     POLYPECTOMY  09/26/2015   Procedure: POLYPECTOMY;  Surgeon: Midge Minium, MD;  Location: [facility];  Service: Endoscopy;;   POLYPECTOMY N/A 09/25/2021   Procedure: POLYPECTOMY;  Surgeon: Midge Minium, MD;  Location: [facility];  Service: Endoscopy;  Laterality: N/A;   Patient Active Problem List   Diagnosis Date Noted   History of colonic polyps    Special screening for malignant neoplasms, colon    Benign neoplasm of ascending colon    Benign neoplasm of sigmoid colon    Mixed incontinence 03/16/2014   Calculus of kidney 03/16/2014   Renal colic 03/16/2014   Pain in shoulder 11/23/2013    Colon polyp 11/10/2013   Cough 11/10/2013   Clinical depression 11/10/2013   Bloodgood disease 11/10/2013   PCP: Riley Kill, PA-C REFERRING PROVIDER: Dr. Carin Primrose  ONSET DATE: 02/06/23  REFERRING DIAG: s/p right TSA  THERAPY DIAG:  Acute pain of right shoulder  Stiffness of right shoulder, not elsewhere classified  Other symptoms and signs involving the musculoskeletal system  Rationale for Evaluation and Treatment: Rehabilitation  SUBJECTIVE:   SUBJECTIVE STATEMENT: S: "I got a workout yesterday doing hair."   PERTINENT HISTORY: Pt is a 78 y/o female s/p right TSA on 02/06/23. Pt is not currently wearing sling. Did not have any HH therapy due to company scheduling. Pt with hx of RCR and bicep tendon repair 10+ years ago.   PRECAUTIONS: Shoulder   WEIGHT BEARING RESTRICTIONS: Yes NWB  PAIN:  Are you having pain? No  FALLS: Has patient fallen in last 6 months? No  PLOF: Independent  PATIENT GOALS: To improve reaching ability.   NEXT MD VISIT: approximately 6 weeks  OBJECTIVE:  Note: Objective measures were completed at Evaluation unless otherwise noted.  HAND DOMINANCE: Right  ADLs: Pt reports difficulty with reaching behind back and into cabinets. Pt has difficulty lifting items.    FUNCTIONAL OUTCOME MEASURES: FOTO: 58/100  UPPER EXTREMITY ROM:       Assessed seated, er/IR adducted  Active ROM Right eval Right 04/11/23  Shoulder flexion 112 124  Shoulder abduction 92 133  Shoulder internal rotation 90 90  Shoulder external rotation 27 36  (Blank rows = not tested)  Assessed supine, er/IR adducted  Passive ROM Right eval Right 04/11/23  Shoulder flexion 140 152  Shoulder abduction 130 180  Shoulder internal rotation 90 90  Shoulder external rotation 25 45  (Blank rows = not tested)  UPPER EXTREMITY MMT:       Assessed on observation, formal MMT not completed due to protocol/precautions  MMT Right eval Right 04/11/23   Shoulder flexion 3/5 4/5  Shoulder abduction 3/5 3+/5  Shoulder internal rotation 3/5 4+/5  Shoulder external rotation 3-/5 3/5  (Blank rows = not tested)  SENSATION: Occasional numbness around incision  OBSERVATIONS: Mod to max fascial restrictions along upper arm and anterior shoulder, mod restrictions in trapezius and scapular regions   TREATMENT DATE:  04/23/23 -Myofascial release and manual techniques to right upper arm, anterior shoulder, trapezius, and scapular regions to decrease pain and fascial restrictions and improve joint ROM -P/ROM: supine-flexion, abduction, horizontal abduction, er, 5 reps -Strengthening: 2#, supine-flexion, abduction, protraction, horizontal abduction, er/IR, 10 reps -X to V arm: 10 reps, RUE only, 1# weight for 5 reps -Object pass (using tape measure) behind back from right to left for IR, 10 passes -Theraband strengthening: green-protraction, horizontal abduction, er, IR, 10 reps -Overhead lacing: 1.5# wrist weight, lacing from top down then reversing -Ball on wall: 1' flexion, 1' abduction -UBE: level 2, 3' forward, 3' reverse, pace: 9.0  04/15/23 -Myofascial release and manual techniques to right upper arm, anterior shoulder, trapezius, and scapular regions to decrease pain and fascial restrictions and improve joint ROM -P/ROM: supine-flexion, abduction, horizontal abduction, er, 5 reps -Strengthening: 1#, supine-flexion, abduction, protraction, horizontal abduction, er/IR, 10 reps -Shoulder stretches: flexion, IR behind back with horizontal towel, 2x20" holds -Object pass (using tape roll) behind back from right to left for IR, 10 passes -Theraband strengthening: red-protraction, horizontal abduction, er, IR, 10 reps -Scapular theraband: red-row, extension, retraction, 10 reps -UBE: level 2, 3' forward, 3' reverse, pace: 9.0  04/11/23 -Myofascial release and manual techniques to right upper arm, anterior shoulder, trapezius, and scapular  regions to decrease pain and fascial restrictions and improve joint ROM -P/ROM: supine-flexion, abduction, horizontal abduction, er, 10 reps -A/ROM: sidelying-flexion, abduction, protraction, horizontal abduction, er/IR, 10 reps -Shoulder stretches: flexion, IR behind back with horizontal towel, 2x20" holds -Ball pass behind back from right to left for IR, 10 passes -Ball on wall: 1' flexion, 1' abduction -Overhead lacing: seated, lacing from top down then reversing -Scapular theraband: red-row, extension, retraction, 10 reps    PATIENT EDUCATION: Education details: reviewed HEP Person educated: Patient Education method: Programmer, multimedia, Demonstration, and Handouts Education comprehension: verbalized understanding and returned demonstration  HOME EXERCISE PROGRAM: Eval: scapular A/ROM, table slides 1/9: AA/ROM 1/23: A/ROM 1/27: Scapular Strengthening 2/3: Shoulder stretches 2/7: Shoulder Strengthening  GOALS: Goals reviewed with patient? Yes  SHORT TERM GOALS: Target date: 03/22/23  Pt will be provided with and educated on HEP to improve mobility in RUE required for use during ADL completion.   Goal status: IN PROGRESS  2.  Pt will increase RUE P/ROM by 10+ degrees to improve ability to use RUE during dressing tasks with minimal compensatory techniques.   Goal status: MET    LONG TERM GOALS: Target date: 04/12/23  Pt will decrease pain  in RUE to 3/10 or less to improve ability to sleep for 2+ consecutive hours without waking due to pain.   Goal status: MET  2.  Pt will decrease RUE fascial restrictions to min amounts or less to improve mobility required for functional reaching tasks.   Goal status: IN PROGRESS  3.  Pt will increase RUE A/ROM by 35+ degrees to improve ability to use RUE when reaching overhead or behind back during dressing and bathing tasks.   Goal status: IN PROGRESS  4.  Pt will increase RUE strength to 4/5 or greater to improve ability to use RUE  when lifting or carrying items during meal preparation/housework/yardwork tasks.   Goal status: IN PROGRESS  ASSESSMENT:  CLINICAL IMPRESSION: Pt reports her left arm is hurting but right arm is ok. Pt has been working without difficulty, does have continued difficulty with reaching behind back in IR. Continued with manual techniques, noting continued improvement in trigger point at medial deltoid. Progressed to strengthening with low level weights using 2# in supine today, and green theraband today. Did not complete flexion or abduction with theraband due to difficulty and form. Added x to v arms using RUE only. Verbal, visual, and tactile cuing for form and technique.   PERFORMANCE DEFICITS: in functional skills including ADLs, IADLs, ROM, strength, pain, fascial restrictions, muscle spasms, and UE functional use     PLAN:  OT FREQUENCY: 1x/week  OT DURATION: 4 weeks  PLANNED INTERVENTIONS: 97168 OT Re-evaluation, 97535 self care/ADL training, 16109 therapeutic exercise, 97530 therapeutic activity, 97140 manual therapy, 97035 ultrasound, 97014 electrical stimulation unattended, patient/family education, and DME and/or AE instructions  CONSULTED AND AGREED WITH PLAN OF CARE: Patient  PLAN FOR NEXT SESSION: Continue with strengthening progression, proximal shoulder strengthening, functional reaching, scapular theraband   Ezra Sites, OTR/L  [phone] 04/23/2023, 12:30 PM

## 2023-04-30 ENCOUNTER — Ambulatory Visit (HOSPITAL_COMMUNITY): Payer: No Typology Code available for payment source | Attending: Orthopedic Surgery | Admitting: Occupational Therapy

## 2023-04-30 ENCOUNTER — Encounter (HOSPITAL_COMMUNITY): Payer: Self-pay | Admitting: Occupational Therapy

## 2023-04-30 DIAGNOSIS — M25511 Pain in right shoulder: Secondary | ICD-10-CM | POA: Insufficient documentation

## 2023-04-30 DIAGNOSIS — R29898 Other symptoms and signs involving the musculoskeletal system: Secondary | ICD-10-CM | POA: Insufficient documentation

## 2023-04-30 DIAGNOSIS — M25611 Stiffness of right shoulder, not elsewhere classified: Secondary | ICD-10-CM | POA: Diagnosis present

## 2023-04-30 NOTE — Therapy (Signed)
 OUTPATIENT OCCUPATIONAL THERAPY ORTHO TREATMENT   Patient Name: Sharon [name] MRN: [id number] DOB:[birth year], 78 y.o., female Today's Date: 04/30/2023    END OF SESSION:  OT End of Session - 04/30/23 1055     Visit Number 15    Number of Visits 16    Date for OT Re-Evaluation 05/11/23    Authorization Type [employer], $25 copay    OT Start Time 1014    OT Stop Time 1056    OT Time Calculation (min) 42 min    Activity Tolerance Patient tolerated treatment well    Behavior During Therapy WFL for tasks assessed/performed                 Past Medical History:  Diagnosis Date   Arthritis    lower back   Dental crowns present    caps on top front teeth   Depression    Dyspnea    GERD (gastroesophageal reflux disease)    Hemorrhoids    Kidney stones    Past Surgical History:  Procedure Laterality Date   ABDOMINAL HYSTERECTOMY     ARTHOSCOPIC ROTAOR CUFF REPAIR     BREAST SURGERY     COLONOSCOPY WITH PROPOFOL N/A 09/26/2015   Procedure: COLONOSCOPY WITH PROPOFOL;  Surgeon: Midge Minium, MD;  Location: [facility];  Service: Endoscopy;  Laterality: N/A;   COLONOSCOPY WITH PROPOFOL N/A 09/25/2021   Procedure: COLONOSCOPY WITH BIOPSIES;  Surgeon: Midge Minium, MD;  Location: [facility];  Service: Endoscopy;  Laterality: N/A;   LEG SURGERY     POLYPECTOMY  09/26/2015   Procedure: POLYPECTOMY;  Surgeon: Midge Minium, MD;  Location: [facility];  Service: Endoscopy;;   POLYPECTOMY N/A 09/25/2021   Procedure: POLYPECTOMY;  Surgeon: Midge Minium, MD;  Location: [facility];  Service: Endoscopy;  Laterality: N/A;   Patient Active Problem List   Diagnosis Date Noted   History of colonic polyps    Special screening for malignant neoplasms, colon    Benign neoplasm of ascending colon    Benign neoplasm of sigmoid colon    Mixed incontinence 03/16/2014   Calculus of kidney 03/16/2014   Renal colic 03/16/2014   Pain in shoulder 11/23/2013    Colon polyp 11/10/2013   Cough 11/10/2013   Clinical depression 11/10/2013   Bloodgood disease 11/10/2013   PCP: Riley Kill, PA-C REFERRING PROVIDER: Dr. Carin Primrose  ONSET DATE: 02/06/23  REFERRING DIAG: s/p right TSA  THERAPY DIAG:  Acute pain of right shoulder  Stiffness of right shoulder, not elsewhere classified  Other symptoms and signs involving the musculoskeletal system  Rationale for Evaluation and Treatment: Rehabilitation  SUBJECTIVE:   SUBJECTIVE STATEMENT: S: "  PERTINENT HISTORY: Pt is a 78 y/o female s/p right TSA on 02/06/23. Pt is not currently wearing sling. Did not have any HH therapy due to company scheduling. Pt with hx of RCR and bicep tendon repair 10+ years ago.   PRECAUTIONS: Shoulder   WEIGHT BEARING RESTRICTIONS: Yes NWB  PAIN:  Are you having pain? No  FALLS: Has patient fallen in last 6 months? No  PLOF: Independent  PATIENT GOALS: To improve reaching ability.   NEXT MD VISIT: approximately 6 weeks  OBJECTIVE:  Note: Objective measures were completed at Evaluation unless otherwise noted.  HAND DOMINANCE: Right  ADLs: Pt reports difficulty with reaching behind back and into cabinets. Pt has difficulty lifting items.    FUNCTIONAL OUTCOME MEASURES: FOTO: 58/100  UPPER EXTREMITY ROM:  Assessed seated, er/IR adducted  Active ROM Right eval Right 04/11/23  Shoulder flexion 112 124  Shoulder abduction 92 133  Shoulder internal rotation 90 90  Shoulder external rotation 27 36  (Blank rows = not tested)  Assessed supine, er/IR adducted  Passive ROM Right eval Right 04/11/23  Shoulder flexion 140 152  Shoulder abduction 130 180  Shoulder internal rotation 90 90  Shoulder external rotation 25 45  (Blank rows = not tested)  UPPER EXTREMITY MMT:       Assessed on observation, formal MMT not completed due to protocol/precautions  MMT Right eval Right 04/11/23  Shoulder flexion 3/5 4/5  Shoulder abduction  3/5 3+/5  Shoulder internal rotation 3/5 4+/5  Shoulder external rotation 3-/5 3/5  (Blank rows = not tested)  SENSATION: Occasional numbness around incision  OBSERVATIONS: Mod to max fascial restrictions along upper arm and anterior shoulder, mod restrictions in trapezius and scapular regions   TREATMENT DATE:  04/30/23 -Myofascial release and manual techniques to right upper arm, anterior shoulder, trapezius, and scapular regions to decrease pain and fascial restrictions and improve joint ROM -P/ROM: supine-flexion, abduction, horizontal abduction, er, 5 reps -Strengthening: 2#, supine-flexion, abduction, protraction, horizontal abduction, er/IR, 15 reps -Shoulder stretches: flexion, IR behind back with horizontal towel, 2x30" holds -Ball pass: behind back from right to left for IR, 15 passes -Strengthening: 1#, standing-flexion, protraction, er/IR, 12 reps -A/ROM: standing-horizontal abduction, abduction, pnf pattern, 12 reps -Ball on wall: 1' flexion, 1' abduction -UBE: level 2, 3' forward, 3' reverse, pace: 8.5  04/23/23 -Myofascial release and manual techniques to right upper arm, anterior shoulder, trapezius, and scapular regions to decrease pain and fascial restrictions and improve joint ROM -P/ROM: supine-flexion, abduction, horizontal abduction, er, 5 reps -Strengthening: 2#, supine-flexion, abduction, protraction, horizontal abduction, er/IR, 10 reps -X to V arm: 10 reps, RUE only, 1# weight for 5 reps -Object pass (using tape measure) behind back from right to left for IR, 10 passes -Theraband strengthening: green-protraction, horizontal abduction, er, IR, 10 reps -Overhead lacing: 1.5# wrist weight, lacing from top down then reversing -Ball on wall: 1' flexion, 1' abduction -UBE: level 2, 3' forward, 3' reverse, pace: 9.0  04/15/23 -Myofascial release and manual techniques to right upper arm, anterior shoulder, trapezius, and scapular regions to decrease pain and fascial  restrictions and improve joint ROM -P/ROM: supine-flexion, abduction, horizontal abduction, er, 5 reps -Strengthening: 1#, supine-flexion, abduction, protraction, horizontal abduction, er/IR, 10 reps -Shoulder stretches: flexion, IR behind back with horizontal towel, 2x20" holds -Object pass (using tape roll) behind back from right to left for IR, 10 passes -Theraband strengthening: red-protraction, horizontal abduction, er, IR, 10 reps -Scapular theraband: red-row, extension, retraction, 10 reps -UBE: level 2, 3' forward, 3' reverse, pace: 9.0    PATIENT EDUCATION: Education details: reviewed HEP Person educated: Patient Education method: Explanation, Demonstration, and Handouts Education comprehension: verbalized understanding and returned demonstration  HOME EXERCISE PROGRAM: Eval: scapular A/ROM, table slides 1/9: AA/ROM 1/23: A/ROM 1/27: Scapular Strengthening 2/3: Shoulder stretches 2/7: Shoulder Strengthening  GOALS: Goals reviewed with patient? Yes  SHORT TERM GOALS: Target date: 03/22/23  Pt will be provided with and educated on HEP to improve mobility in RUE required for use during ADL completion.   Goal status: IN PROGRESS  2.  Pt will increase RUE P/ROM by 10+ degrees to improve ability to use RUE during dressing tasks with minimal compensatory techniques.   Goal status: MET    LONG TERM GOALS: Target date: 04/12/23  Pt will decrease pain  in RUE to 3/10 or less to improve ability to sleep for 2+ consecutive hours without waking due to pain.   Goal status: MET  2.  Pt will decrease RUE fascial restrictions to min amounts or less to improve mobility required for functional reaching tasks.   Goal status: IN PROGRESS  3.  Pt will increase RUE A/ROM by 35+ degrees to improve ability to use RUE when reaching overhead or behind back during dressing and bathing tasks.   Goal status: IN PROGRESS  4.  Pt will increase RUE strength to 4/5 or greater to improve  ability to use RUE when lifting or carrying items during meal preparation/housework/yardwork tasks.   Goal status: IN PROGRESS  ASSESSMENT:  CLINICAL IMPRESSION: Pt reports she has not completed her HEP diligently due to having a lot going on. Pt asking about having difficulty lifting heavy dishes into overhead cabinets. Discussed progression of strength in RUE, as well as pain in LUE, and recommended pt move the heavy items to the lower cabinets and put the lighter items on higher shelves. Continued with strengthening today, using RUE only due to LUE pain with mobility. Graded weight and challenges as needed while watching form and posture. Pt with improved stability and activity tolerance during ball on wall today. Also working towards greater mobility with IR behind back, discussed need to improve mobility reaching across the back before moving hand up the back. Verbal cuing for form and technique.   PERFORMANCE DEFICITS: in functional skills including ADLs, IADLs, ROM, strength, pain, fascial restrictions, muscle spasms, and UE functional use     PLAN:  OT FREQUENCY: 1x/week  OT DURATION: 4 weeks  PLANNED INTERVENTIONS: 97168 OT Re-evaluation, 97535 self care/ADL training, 16109 therapeutic exercise, 97530 therapeutic activity, 97140 manual therapy, 97035 ultrasound, 97014 electrical stimulation unattended, patient/family education, and DME and/or AE instructions  CONSULTED AND AGREED WITH PLAN OF CARE: Patient  PLAN FOR NEXT SESSION: Reassess and discharge with HEP   Ezra Sites, OTR/L  [phone] 04/30/2023, 10:57 AM

## 2023-05-07 ENCOUNTER — Encounter (HOSPITAL_COMMUNITY): Payer: No Typology Code available for payment source | Admitting: Occupational Therapy

## 2023-09-12 ENCOUNTER — Ambulatory Visit: Admitting: Family Medicine

## 2023-09-12 ENCOUNTER — Encounter: Payer: Self-pay | Admitting: Family Medicine

## 2023-09-12 VITALS — BP 118/80 | HR 94 | Temp 97.6°F | Ht 67.5 in | Wt 188.0 lb

## 2023-09-12 DIAGNOSIS — Z8551 Personal history of malignant neoplasm of bladder: Secondary | ICD-10-CM

## 2023-09-12 DIAGNOSIS — G4700 Insomnia, unspecified: Secondary | ICD-10-CM | POA: Diagnosis not present

## 2023-09-12 DIAGNOSIS — D649 Anemia, unspecified: Secondary | ICD-10-CM

## 2023-09-12 MED ORDER — TRAZODONE HCL 50 MG PO TABS: 25.0000 mg | ORAL_TABLET | Freq: Every evening | ORAL | 3 refills | Status: AC | PRN

## 2023-09-12 NOTE — Patient Instructions (Signed)
 Medication as prescribed. If you develop symptoms as below stop immediately and seek medical attention. Mental Status Changes: Anxiety, agitation, restlessness, confusion, delirium.  Autonomic Nervous System Overactivity: Tachycardia, hypertension, hyperthermia, diaphoresis (excessive sweating), shivering, vomiting, diarrhea.  Neuromuscular Abnormalities: Tremor, muscle rigidity, hyperreflexia, clonus (muscle spasms), myoclonus (muscle twitching).   Referral placed.  Follow up in 6 months.

## 2023-09-13 ENCOUNTER — Ambulatory Visit: Payer: Self-pay | Admitting: Family Medicine

## 2023-09-13 LAB — CBC
Hematocrit: 45.4 % (ref 34.0–46.6)
Hemoglobin: 14.3 g/dL (ref 11.1–15.9)
MCH: 27.3 pg (ref 26.6–33.0)
MCHC: 31.5 g/dL (ref 31.5–35.7)
MCV: 87 fL (ref 79–97)
Platelets: 285 x10E3/uL (ref 150–450)
RBC: 5.24 x10E6/uL (ref 3.77–5.28)
RDW: 13.2 % (ref 11.7–15.4)
WBC: 8.8 x10E3/uL (ref 3.4–10.8)

## 2023-09-15 NOTE — Assessment & Plan Note (Signed)
Referral to Urology for surveillance.

## 2023-09-15 NOTE — Assessment & Plan Note (Signed)
 Uncontrolled/currently exacerbation. Trial of Trazodone . Can stop Gabapentin.

## 2023-09-15 NOTE — Progress Notes (Signed)
 Subjective:  Patient ID: Sharon [name], female    DOB: [birth year]  Age: 78 y.o. MRN: [id number]  CC:   Chief Complaint  Patient presents with   New Patient (Initial Visit)    Here to establish care, former Dr is retiring.  Husband is in hospice.     HPI:  78 year old female with the below mentioned medical problems presents to establish care.  Currently having a difficult time. Husband is in hospice. PHQ9 - 9, GAD7 - 7.   Has a remote history of bladder cancer. Last visit with Urology was about 5 years ago. Will discuss referral today.  Reports insomnia. Former PCP recently added Gabapentin. Doesn't seem to be helping.   Chart review revealed last Hb was low (this was at the time that she had surgery). Will recheck today.  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   Clinical depression 11/10/2013    Social Hx   Social History   Socioeconomic History   Marital status: Married    Spouse name: Not on file   Number of children: Not on file   Years of education: Not on file   Highest education level: Not on file  Occupational History   Not on file  Tobacco Use   Smoking status: Never   Smokeless tobacco: Never  Vaping Use   Vaping status: Never Used  Substance and Sexual Activity   Alcohol use: No   Drug use: No   Sexual activity: Not Currently    Birth control/protection: None  Other Topics Concern   Not on file  Social History Narrative   Not on file   Social Drivers of Health   Financial Resource Strain: Low Risk  (12/25/2021)   Received from [facility]   Overall Financial Resource Strain (CARDIA)    Difficulty of Paying Living  Expenses: Not hard at all  Food Insecurity: No Food Insecurity (08/29/2023)   Received from [facility]   Hunger Vital Sign    Within the past 12 months, you worried that your food would run out before you got the money to buy more.: Never true    Within the past 12 months, the food you bought just didn't last and you didn't have money to get more.: Never true  Transportation Needs: No Transportation Needs (08/29/2023)   Received from [facility] - Transportation    Lack of Transportation (Medical): No    Lack of Transportation (Non-Medical): No  Physical Activity: Inactive (12/25/2021)   Received from [facility]   Exercise Vital Sign    On average, how many days per week do you engage in moderate to strenuous exercise (like a brisk walk)?: 0 days    On average, how many minutes do you engage in exercise at this level?: 0 min  Stress: No Stress Concern Present (12/25/2021)   Received from [facility] - Occupational Stress Questionnaire    Feeling of Stress : Not at all  Social Connections: Socially Integrated (12/25/2021)   Received from [facility]   Social Connection and Isolation Panel  In a typical week, how many times do you talk on the phone with family, friends, or neighbors?: More than three times a week    How often do you get together with friends or relatives?: More than three times a week    How often do you attend church or religious services?: More than 4 times per year    Do you belong to any clubs or organizations such as church groups, unions, fraternal or athletic groups, or school groups?: No    How often do you attend meetings of the clubs or organizations you belong to?: More than 4 times per year    Are you married, widowed, divorced, separated, never married, or living with a partner?: Married    Review of Systems Per HPI  Objective:  BP 118/80   Pulse 94   Temp 97.6 F (36.4 C)   Ht  5' 7.5 (1.715 m)   Wt 188 lb (85.3 kg)   SpO2 96%   BMI 29.01 kg/m      09/12/2023    1:26 PM 08/17/2022    9:26 AM 09/25/2021    9:54 AM  BP/Weight  Systolic BP 118 135 119  Diastolic BP 80 88 76  Wt. (Lbs) 188    BMI 29.01 kg/m2      Physical Exam Vitals and nursing note reviewed.  Constitutional:      General: She is not in acute distress.    Appearance: Normal appearance.  HENT:     Head: Normocephalic and atraumatic.  Eyes:     General:        Right eye: No discharge.        Left eye: No discharge.     Conjunctiva/sclera: Conjunctivae normal.  Cardiovascular:     Rate and Rhythm: Normal rate and regular rhythm.  Pulmonary:     Effort: Pulmonary effort is normal.     Breath sounds: Normal breath sounds. No wheezing, rhonchi or rales.  Neurological:     Mental Status: She is alert.  Psychiatric:        Behavior: Behavior normal.     Comments: Depressed mood.     Lab Results  Component Value Date   WBC 8.8 09/12/2023   HGB 14.3 09/12/2023   HCT 45.4 09/12/2023   PLT 285 09/12/2023   GLUCOSE 124 (H) 03/30/2015   ALT 20 03/30/2015   AST 22 03/30/2015   NA 142 03/30/2015   K 3.5 03/30/2015   CL 103 03/30/2015   CREATININE 1.00 03/30/2015   BUN 26 (H) 03/30/2015   CO2 26 03/30/2015   INR 0.98 03/30/2015     Assessment & Plan:  Insomnia, unspecified type Assessment & Plan: Uncontrolled/currently exacerbation. Trial of Trazodone . Can stop Gabapentin.  Orders: -     traZODone  HCl; Take 0.5-1 tablets (25-50 mg total) by mouth at bedtime as needed for sleep.  Dispense: 30 tablet; Refill: 3  Anemia, unspecified type -     CBC  History of bladder cancer Assessment & Plan: Referral to Urology for surveillance.   Orders: -     Ambulatory referral to Urology    Follow-up:  6 months or sooner if needed.  Jacqulyn Ahle DO [facility]

## 2023-09-23 ENCOUNTER — Ambulatory Visit: Admitting: Urology

## 2023-12-10 ENCOUNTER — Other Ambulatory Visit: Payer: Self-pay | Admitting: Family Medicine

## 2023-12-10 MED ORDER — ATORVASTATIN CALCIUM 10 MG PO TABS: 10.0000 mg | ORAL_TABLET | Freq: Every day | ORAL | 3 refills | Status: AC

## 2023-12-10 MED ORDER — DULOXETINE HCL 60 MG PO CPEP: 60.0000 mg | ORAL_CAPSULE | Freq: Every day | ORAL | 3 refills | Status: AC

## 2023-12-10 NOTE — Telephone Encounter (Signed)
 Copied from CRM [id number]. Topic: Clinical - Medication Refill >> Dec 10, 2023  3:04 PM Harlene ORN wrote: Medication: DULoxetine (CYMBALTA) 60 MG capsule, atorvastatin (LIPITOR) 20 MG tablet  Has the patient contacted their pharmacy? No (Agent: If no, request that the patient contact the pharmacy for the refill. If patient does not wish to contact the pharmacy document the reason why and proceed with request.) (Agent: If yes, when and what did the pharmacy advise?)  This is the patient's preferred pharmacy:  [facility] [address] [place] - [address] [place] [address] Phone: [phone] Fax: [phone]  Is this the correct pharmacy for this prescription? Yes If no, delete pharmacy and type the correct one.   Has the prescription been filled recently? No  Is the patient out of the medication? Yes  Has the patient been seen for an appointment in the last year OR does the patient have an upcoming appointment? Yes  Can we respond through MyChart? No  Agent: Please be advised that Rx refills may take up to 3 business days. We ask that you follow-up with your pharmacy.

## 2024-01-10 ENCOUNTER — Encounter: Payer: Self-pay | Admitting: Physician Assistant

## 2024-01-10 ENCOUNTER — Ambulatory Visit: Admitting: Physician Assistant

## 2024-01-10 ENCOUNTER — Ambulatory Visit: Payer: Self-pay

## 2024-01-10 VITALS — BP 126/81 | HR 51 | Temp 97.3°F | Ht 67.5 in | Wt 192.5 lb

## 2024-01-10 DIAGNOSIS — B9689 Other specified bacterial agents as the cause of diseases classified elsewhere: Secondary | ICD-10-CM

## 2024-01-10 DIAGNOSIS — J069 Acute upper respiratory infection, unspecified: Secondary | ICD-10-CM | POA: Diagnosis not present

## 2024-01-10 MED ORDER — DOXYCYCLINE HYCLATE 100 MG PO TABS: 100.0000 mg | ORAL_TABLET | Freq: Two times a day (BID) | ORAL | 0 refills | Status: AC

## 2024-01-10 MED ORDER — PROMETHAZINE-DM 6.25-15 MG/5ML PO SYRP: 5.0000 mL | ORAL_SOLUTION | Freq: Four times a day (QID) | ORAL | 0 refills | Status: AC | PRN

## 2024-01-10 MED ORDER — ALBUTEROL SULFATE HFA 108 (90 BASE) MCG/ACT IN AERS: 1.0000 | INHALATION_SPRAY | Freq: Four times a day (QID) | RESPIRATORY_TRACT | 0 refills | Status: AC | PRN

## 2024-01-10 NOTE — Progress Notes (Signed)
 Acute Office Visit  Subjective:     Patient ID: Sharon [name], female    DOB: 02-25-1946, 78 y.o.   MRN: [id number]   Discussed the use of AI scribe software for clinical note transcription with the patient, who gave verbal consent to proceed.  History of Present Illness Sharon [name] is a 78 year old female who presents with cough, congestion, and pain behind the left eye.  She has experienced cough, congestion, and fatigue for the past week. There is a sensation of blisters in her mouth. She has not taken any medications for these symptoms.  She experiences excruciating, intermittent pain behind her left eye, occurring in flashes for the past week.  Wheezing is present, and she suspects progression to bronchitis. She has not used her albuterol  inhaler as she is unsure of its location. There is no chest pain or shortness of breath. She feels generally unwell without any fevers at home.  She has an allergy to penicillin.    Review of Systems  Constitutional:  Positive for malaise/fatigue. Negative for chills and fever.  HENT:  Positive for congestion, sinus pain and sore throat. Negative for ear pain.   Respiratory:  Positive for cough and wheezing. Negative for shortness of breath.   Cardiovascular:  Negative for chest pain and palpitations.  Musculoskeletal:  Negative for joint pain and myalgias.  Neurological:  Positive for headaches. Negative for dizziness.        Objective:     BP 126/81   Pulse (!) 51   Temp (!) 97.3 F (36.3 C)   Ht 5' 7.5 (1.715 m)   Wt 192 lb 8 oz (87.3 kg)   SpO2 90%   BMI 29.70 kg/m   Physical Exam Vitals reviewed.  Constitutional:      General: She is not in acute distress.    Appearance: Normal appearance. She is ill-appearing.  HENT:     Nose: Congestion and rhinorrhea present.     Right Sinus: No frontal sinus tenderness.     Left Sinus: Frontal sinus tenderness present.     Mouth/Throat:     Mouth: Mucous membranes are  moist.     Pharynx: Oropharynx is clear. Posterior oropharyngeal erythema present.  Eyes:     Extraocular Movements: Extraocular movements intact.     Conjunctiva/sclera: Conjunctivae normal.  Cardiovascular:     Rate and Rhythm: Normal rate and regular rhythm.     Heart sounds: Normal heart sounds. No murmur heard.    No friction rub.  Pulmonary:     Effort: Pulmonary effort is normal.     Breath sounds: Normal breath sounds. No wheezing, rhonchi or rales.  Musculoskeletal:     Cervical back: No tenderness.  Lymphadenopathy:     Cervical: No cervical adenopathy.  Skin:    General: Skin is warm and dry.  Neurological:     General: No focal deficit present.     Mental Status: She is alert and oriented to person, place, and time.  Psychiatric:        Mood and Affect: Mood normal.        Behavior: Behavior normal.     No results found for any visits on 01/10/24.      Assessment & Plan:  Bacterial URI Assessment & Plan: Patient appears stable today. Benign exam, mild wheezing throughout lungs, however, no indication for chest XR at this time. Discussed that this fits the picture of viral vs bacterial URI and that due to  type and duration of symptoms and exam findings, we will treat as bacterial URI. No evidence of other bacterial infections including pneumonia, pharyngitis, sinusitis, or otitis media. Supportive care reviewed with patient. Tylenol or ibuprofen for pain as needed. May continue with OTC cold medications. Patient instructed to return to clinic if worsening shortness of breath, chest pain, hypoxia, or other concerns. Patient agreeable to plan.   Orders: -     Doxycycline Hyclate; Take 1 tablet (100 mg total) by mouth 2 (two) times daily for 7 days.  Dispense: 14 tablet; Refill: 0 -     Promethazine -DM; Take 5 mLs by mouth 4 (four) times daily as needed for cough.  Dispense: 118 mL; Refill: 0 -     Albuterol  Sulfate HFA; Inhale 1-2 puffs into the lungs every 6 (six)  hours as needed for wheezing or shortness of breath.  Dispense: 18 g; Refill: 0     Return if symptoms worsen or fail to improve.  Charmaine Matias Thurman, PA-C

## 2024-01-10 NOTE — Assessment & Plan Note (Signed)
 Patient appears stable today. Benign exam, mild wheezing throughout lungs, however, no indication for chest XR at this time. Discussed that this fits the picture of viral vs bacterial URI and that due to type and duration of symptoms and exam findings, we will treat as bacterial URI. No evidence of other bacterial infections including pneumonia, pharyngitis, sinusitis, or otitis media. Supportive care reviewed with patient. Tylenol or ibuprofen for pain as needed. May continue with OTC cold medications. Patient instructed to return to clinic if worsening shortness of breath, chest pain, hypoxia, or other concerns. Patient agreeable to plan.

## 2024-01-10 NOTE — Telephone Encounter (Signed)
 FYI Only or Action Required?: FYI only for provider: appointment scheduled on 01/10/24.  Patient was last seen in primary care on 09/12/2023 by Cook, Jayce G, DO.  Called Nurse Triage reporting Cough and Sore Throat.  Symptoms began 2 days ago.  Interventions attempted: Rest, hydration, or home remedies.  Symptoms are: gradually worsening.  Triage Disposition: See Physician Within 24 Hours  Patient/caregiver understands and will follow disposition?: Yes Reason for Disposition  [1] Continuous (nonstop) coughing interferes with work or school AND [2] no improvement using cough treatment per Care Advice  Answer Assessment - Initial Assessment Questions Patient states has not taken anything OTC, has not felt like leaving the house to get anything. Patient made aware of [employer] address change  1. ONSET: When did the cough begin?      2 days ago  2. SEVERITY: How bad is the cough today?      Coughed all night, not getting better  3. SPUTUM: Describe the color of your sputum (e.g., none, dry cough; clear, white, yellow, green)     Yellow  4. DIFFICULTY BREATHING: Are you having difficulty breathing? If Yes, ask: How bad is it? (e.g., mild, moderate, severe)      Denies  5. FEVER: Do you have a fever? If Yes, ask: What is your temperature, how was it measured, and when did it start?     Does not think so  6. CARDIAC HISTORY: Do you have any history of heart disease? (e.g., heart attack, congestive heart failure)      Denies  7. LUNG HISTORY: Do you have any history of lung disease?  (e.g., pulmonary embolus, asthma, emphysema)     Denies  8. OTHER SYMPTOMS: Do you have any other symptoms? (e.g., runny nose, wheezing, chest pain)       Fatigue, weak, sore throat, sharp pain over left eye, chest pain with coughing, sinus pressure  Protocols used: Cough - Acute Productive-A-AH  Copied from CRM #[id number]. Topic: Clinical - Red Word Triage >> Jan 10, 2024 10:40 AM  Sharon [name] wrote: Red Word that prompted transfer to Nurse Triage: Sharon [name], Sharon [name] case manager stated patient is experiencing symptoms of Bronchitis: cough, chest pain, fatigue, sore throat

## 2024-03-16 ENCOUNTER — Ambulatory Visit: Admitting: Family Medicine
